# Patient Record
Sex: Male | Born: 1940 | Race: White | Hispanic: No | Marital: Married | Smoking: Former smoker
Health system: Southern US, Community
[De-identification: ages and names within clinical notes are randomized; demographics above are authoritative.]

## PROBLEM LIST (undated history)

## (undated) DIAGNOSIS — E785 Hyperlipidemia, unspecified: Secondary | ICD-10-CM

## (undated) HISTORY — PX: CHOLECYSTECTOMY: SHX55

---

## 2009-07-08 ENCOUNTER — Encounter: Admission: RE | Admit: 2009-07-08 | Discharge: 2009-07-08 | Payer: Self-pay | Admitting: Family Medicine

## 2012-04-19 DIAGNOSIS — Z85828 Personal history of other malignant neoplasm of skin: Secondary | ICD-10-CM | POA: Diagnosis not present

## 2012-04-19 DIAGNOSIS — L570 Actinic keratosis: Secondary | ICD-10-CM | POA: Diagnosis not present

## 2012-04-19 DIAGNOSIS — D235 Other benign neoplasm of skin of trunk: Secondary | ICD-10-CM | POA: Diagnosis not present

## 2012-09-11 DIAGNOSIS — H2510 Age-related nuclear cataract, unspecified eye: Secondary | ICD-10-CM | POA: Diagnosis not present

## 2012-09-11 DIAGNOSIS — H524 Presbyopia: Secondary | ICD-10-CM | POA: Diagnosis not present

## 2012-09-11 DIAGNOSIS — H52209 Unspecified astigmatism, unspecified eye: Secondary | ICD-10-CM | POA: Diagnosis not present

## 2012-09-11 DIAGNOSIS — H43819 Vitreous degeneration, unspecified eye: Secondary | ICD-10-CM | POA: Diagnosis not present

## 2012-10-19 DIAGNOSIS — M25519 Pain in unspecified shoulder: Secondary | ICD-10-CM | POA: Diagnosis not present

## 2013-05-30 DIAGNOSIS — L570 Actinic keratosis: Secondary | ICD-10-CM | POA: Diagnosis not present

## 2013-05-30 DIAGNOSIS — D235 Other benign neoplasm of skin of trunk: Secondary | ICD-10-CM | POA: Diagnosis not present

## 2013-09-21 DIAGNOSIS — H2510 Age-related nuclear cataract, unspecified eye: Secondary | ICD-10-CM | POA: Diagnosis not present

## 2013-09-21 DIAGNOSIS — H524 Presbyopia: Secondary | ICD-10-CM | POA: Diagnosis not present

## 2013-09-21 DIAGNOSIS — H52209 Unspecified astigmatism, unspecified eye: Secondary | ICD-10-CM | POA: Diagnosis not present

## 2013-09-21 DIAGNOSIS — H43819 Vitreous degeneration, unspecified eye: Secondary | ICD-10-CM | POA: Diagnosis not present

## 2014-03-27 DIAGNOSIS — L570 Actinic keratosis: Secondary | ICD-10-CM | POA: Diagnosis not present

## 2014-03-27 DIAGNOSIS — L820 Inflamed seborrheic keratosis: Secondary | ICD-10-CM | POA: Diagnosis not present

## 2014-03-27 DIAGNOSIS — C4441 Basal cell carcinoma of skin of scalp and neck: Secondary | ICD-10-CM | POA: Diagnosis not present

## 2014-03-27 DIAGNOSIS — L821 Other seborrheic keratosis: Secondary | ICD-10-CM | POA: Diagnosis not present

## 2014-04-17 DIAGNOSIS — Z85828 Personal history of other malignant neoplasm of skin: Secondary | ICD-10-CM | POA: Diagnosis not present

## 2014-04-17 DIAGNOSIS — L570 Actinic keratosis: Secondary | ICD-10-CM | POA: Diagnosis not present

## 2014-08-26 DIAGNOSIS — L570 Actinic keratosis: Secondary | ICD-10-CM | POA: Diagnosis not present

## 2014-08-26 DIAGNOSIS — X32XXXD Exposure to sunlight, subsequent encounter: Secondary | ICD-10-CM | POA: Diagnosis not present

## 2014-08-26 DIAGNOSIS — B351 Tinea unguium: Secondary | ICD-10-CM | POA: Diagnosis not present

## 2014-09-04 DIAGNOSIS — M79672 Pain in left foot: Secondary | ICD-10-CM | POA: Diagnosis not present

## 2015-06-02 DIAGNOSIS — L570 Actinic keratosis: Secondary | ICD-10-CM | POA: Diagnosis not present

## 2015-06-02 DIAGNOSIS — X32XXXD Exposure to sunlight, subsequent encounter: Secondary | ICD-10-CM | POA: Diagnosis not present

## 2015-06-02 DIAGNOSIS — L820 Inflamed seborrheic keratosis: Secondary | ICD-10-CM | POA: Diagnosis not present

## 2015-07-15 DIAGNOSIS — Z1211 Encounter for screening for malignant neoplasm of colon: Secondary | ICD-10-CM | POA: Diagnosis not present

## 2015-07-15 DIAGNOSIS — E7800 Pure hypercholesterolemia, unspecified: Secondary | ICD-10-CM | POA: Diagnosis not present

## 2015-07-15 DIAGNOSIS — N319 Neuromuscular dysfunction of bladder, unspecified: Secondary | ICD-10-CM | POA: Diagnosis not present

## 2015-07-15 DIAGNOSIS — R972 Elevated prostate specific antigen [PSA]: Secondary | ICD-10-CM | POA: Diagnosis not present

## 2015-07-15 DIAGNOSIS — M179 Osteoarthritis of knee, unspecified: Secondary | ICD-10-CM | POA: Diagnosis not present

## 2015-07-15 DIAGNOSIS — Z0001 Encounter for general adult medical examination with abnormal findings: Secondary | ICD-10-CM | POA: Diagnosis not present

## 2015-09-03 DIAGNOSIS — R197 Diarrhea, unspecified: Secondary | ICD-10-CM | POA: Diagnosis not present

## 2015-09-03 DIAGNOSIS — T6291XA Toxic effect of unspecified noxious substance eaten as food, accidental (unintentional), initial encounter: Secondary | ICD-10-CM | POA: Diagnosis not present

## 2015-09-23 DIAGNOSIS — R0982 Postnasal drip: Secondary | ICD-10-CM | POA: Diagnosis not present

## 2015-11-12 DIAGNOSIS — H2513 Age-related nuclear cataract, bilateral: Secondary | ICD-10-CM | POA: Diagnosis not present

## 2015-11-12 DIAGNOSIS — H43813 Vitreous degeneration, bilateral: Secondary | ICD-10-CM | POA: Diagnosis not present

## 2015-11-12 DIAGNOSIS — H52203 Unspecified astigmatism, bilateral: Secondary | ICD-10-CM | POA: Diagnosis not present

## 2015-11-12 DIAGNOSIS — H524 Presbyopia: Secondary | ICD-10-CM | POA: Diagnosis not present

## 2015-12-15 DIAGNOSIS — L308 Other specified dermatitis: Secondary | ICD-10-CM | POA: Diagnosis not present

## 2015-12-15 DIAGNOSIS — X32XXXD Exposure to sunlight, subsequent encounter: Secondary | ICD-10-CM | POA: Diagnosis not present

## 2015-12-15 DIAGNOSIS — Z08 Encounter for follow-up examination after completed treatment for malignant neoplasm: Secondary | ICD-10-CM | POA: Diagnosis not present

## 2015-12-15 DIAGNOSIS — Z85828 Personal history of other malignant neoplasm of skin: Secondary | ICD-10-CM | POA: Diagnosis not present

## 2015-12-15 DIAGNOSIS — Z1283 Encounter for screening for malignant neoplasm of skin: Secondary | ICD-10-CM | POA: Diagnosis not present

## 2015-12-15 DIAGNOSIS — L570 Actinic keratosis: Secondary | ICD-10-CM | POA: Diagnosis not present

## 2016-06-28 DIAGNOSIS — L570 Actinic keratosis: Secondary | ICD-10-CM | POA: Diagnosis not present

## 2016-06-28 DIAGNOSIS — L820 Inflamed seborrheic keratosis: Secondary | ICD-10-CM | POA: Diagnosis not present

## 2016-06-28 DIAGNOSIS — X32XXXD Exposure to sunlight, subsequent encounter: Secondary | ICD-10-CM | POA: Diagnosis not present

## 2016-06-28 DIAGNOSIS — B078 Other viral warts: Secondary | ICD-10-CM | POA: Diagnosis not present

## 2016-08-11 DIAGNOSIS — Z0000 Encounter for general adult medical examination without abnormal findings: Secondary | ICD-10-CM | POA: Diagnosis not present

## 2016-08-11 DIAGNOSIS — N319 Neuromuscular dysfunction of bladder, unspecified: Secondary | ICD-10-CM | POA: Diagnosis not present

## 2016-08-11 DIAGNOSIS — Z1211 Encounter for screening for malignant neoplasm of colon: Secondary | ICD-10-CM | POA: Diagnosis not present

## 2016-08-11 DIAGNOSIS — Z125 Encounter for screening for malignant neoplasm of prostate: Secondary | ICD-10-CM | POA: Diagnosis not present

## 2016-08-11 DIAGNOSIS — E7800 Pure hypercholesterolemia, unspecified: Secondary | ICD-10-CM | POA: Diagnosis not present

## 2016-08-11 DIAGNOSIS — M179 Osteoarthritis of knee, unspecified: Secondary | ICD-10-CM | POA: Diagnosis not present

## 2016-08-31 DIAGNOSIS — Z1211 Encounter for screening for malignant neoplasm of colon: Secondary | ICD-10-CM | POA: Diagnosis not present

## 2016-12-20 DIAGNOSIS — L570 Actinic keratosis: Secondary | ICD-10-CM | POA: Diagnosis not present

## 2016-12-20 DIAGNOSIS — X32XXXD Exposure to sunlight, subsequent encounter: Secondary | ICD-10-CM | POA: Diagnosis not present

## 2016-12-20 DIAGNOSIS — C44319 Basal cell carcinoma of skin of other parts of face: Secondary | ICD-10-CM | POA: Diagnosis not present

## 2016-12-20 DIAGNOSIS — L821 Other seborrheic keratosis: Secondary | ICD-10-CM | POA: Diagnosis not present

## 2016-12-20 DIAGNOSIS — D225 Melanocytic nevi of trunk: Secondary | ICD-10-CM | POA: Diagnosis not present

## 2016-12-20 DIAGNOSIS — D1801 Hemangioma of skin and subcutaneous tissue: Secondary | ICD-10-CM | POA: Diagnosis not present

## 2017-01-26 DIAGNOSIS — Z85828 Personal history of other malignant neoplasm of skin: Secondary | ICD-10-CM | POA: Diagnosis not present

## 2017-01-26 DIAGNOSIS — L570 Actinic keratosis: Secondary | ICD-10-CM | POA: Diagnosis not present

## 2017-01-26 DIAGNOSIS — X32XXXD Exposure to sunlight, subsequent encounter: Secondary | ICD-10-CM | POA: Diagnosis not present

## 2017-01-26 DIAGNOSIS — Z08 Encounter for follow-up examination after completed treatment for malignant neoplasm: Secondary | ICD-10-CM | POA: Diagnosis not present

## 2017-01-26 DIAGNOSIS — C44319 Basal cell carcinoma of skin of other parts of face: Secondary | ICD-10-CM | POA: Diagnosis not present

## 2017-06-13 DIAGNOSIS — L820 Inflamed seborrheic keratosis: Secondary | ICD-10-CM | POA: Diagnosis not present

## 2017-06-13 DIAGNOSIS — L308 Other specified dermatitis: Secondary | ICD-10-CM | POA: Diagnosis not present

## 2017-06-13 DIAGNOSIS — L570 Actinic keratosis: Secondary | ICD-10-CM | POA: Diagnosis not present

## 2017-06-13 DIAGNOSIS — D225 Melanocytic nevi of trunk: Secondary | ICD-10-CM | POA: Diagnosis not present

## 2017-06-13 DIAGNOSIS — X32XXXD Exposure to sunlight, subsequent encounter: Secondary | ICD-10-CM | POA: Diagnosis not present

## 2017-08-22 DIAGNOSIS — X32XXXD Exposure to sunlight, subsequent encounter: Secondary | ICD-10-CM | POA: Diagnosis not present

## 2017-08-22 DIAGNOSIS — L820 Inflamed seborrheic keratosis: Secondary | ICD-10-CM | POA: Diagnosis not present

## 2017-08-22 DIAGNOSIS — Z08 Encounter for follow-up examination after completed treatment for malignant neoplasm: Secondary | ICD-10-CM | POA: Diagnosis not present

## 2017-08-22 DIAGNOSIS — Z85828 Personal history of other malignant neoplasm of skin: Secondary | ICD-10-CM | POA: Diagnosis not present

## 2017-08-22 DIAGNOSIS — L570 Actinic keratosis: Secondary | ICD-10-CM | POA: Diagnosis not present

## 2017-09-14 DIAGNOSIS — E7800 Pure hypercholesterolemia, unspecified: Secondary | ICD-10-CM | POA: Diagnosis not present

## 2017-09-14 DIAGNOSIS — Z125 Encounter for screening for malignant neoplasm of prostate: Secondary | ICD-10-CM | POA: Diagnosis not present

## 2017-09-14 DIAGNOSIS — R8290 Unspecified abnormal findings in urine: Secondary | ICD-10-CM | POA: Diagnosis not present

## 2017-09-14 DIAGNOSIS — N319 Neuromuscular dysfunction of bladder, unspecified: Secondary | ICD-10-CM | POA: Diagnosis not present

## 2017-09-14 DIAGNOSIS — Z0000 Encounter for general adult medical examination without abnormal findings: Secondary | ICD-10-CM | POA: Diagnosis not present

## 2017-09-14 DIAGNOSIS — R972 Elevated prostate specific antigen [PSA]: Secondary | ICD-10-CM | POA: Diagnosis not present

## 2017-09-14 DIAGNOSIS — M179 Osteoarthritis of knee, unspecified: Secondary | ICD-10-CM | POA: Diagnosis not present

## 2017-12-14 DIAGNOSIS — X32XXXD Exposure to sunlight, subsequent encounter: Secondary | ICD-10-CM | POA: Diagnosis not present

## 2017-12-14 DIAGNOSIS — L258 Unspecified contact dermatitis due to other agents: Secondary | ICD-10-CM | POA: Diagnosis not present

## 2017-12-14 DIAGNOSIS — L570 Actinic keratosis: Secondary | ICD-10-CM | POA: Diagnosis not present

## 2017-12-14 DIAGNOSIS — B351 Tinea unguium: Secondary | ICD-10-CM | POA: Diagnosis not present

## 2017-12-19 DIAGNOSIS — H43813 Vitreous degeneration, bilateral: Secondary | ICD-10-CM | POA: Diagnosis not present

## 2017-12-19 DIAGNOSIS — H524 Presbyopia: Secondary | ICD-10-CM | POA: Diagnosis not present

## 2017-12-19 DIAGNOSIS — H2513 Age-related nuclear cataract, bilateral: Secondary | ICD-10-CM | POA: Diagnosis not present

## 2017-12-19 DIAGNOSIS — H52203 Unspecified astigmatism, bilateral: Secondary | ICD-10-CM | POA: Diagnosis not present

## 2018-01-21 DIAGNOSIS — R509 Fever, unspecified: Secondary | ICD-10-CM | POA: Diagnosis not present

## 2018-01-23 DIAGNOSIS — N390 Urinary tract infection, site not specified: Secondary | ICD-10-CM | POA: Diagnosis not present

## 2018-03-13 DIAGNOSIS — R338 Other retention of urine: Secondary | ICD-10-CM | POA: Diagnosis not present

## 2018-04-11 DIAGNOSIS — R338 Other retention of urine: Secondary | ICD-10-CM | POA: Diagnosis not present

## 2018-04-11 DIAGNOSIS — N139 Obstructive and reflux uropathy, unspecified: Secondary | ICD-10-CM | POA: Diagnosis not present

## 2018-08-16 DIAGNOSIS — N312 Flaccid neuropathic bladder, not elsewhere classified: Secondary | ICD-10-CM | POA: Diagnosis not present

## 2018-08-16 DIAGNOSIS — R338 Other retention of urine: Secondary | ICD-10-CM | POA: Diagnosis not present

## 2018-10-05 DIAGNOSIS — N319 Neuromuscular dysfunction of bladder, unspecified: Secondary | ICD-10-CM | POA: Diagnosis not present

## 2018-10-05 DIAGNOSIS — Z0000 Encounter for general adult medical examination without abnormal findings: Secondary | ICD-10-CM | POA: Diagnosis not present

## 2018-10-05 DIAGNOSIS — E7800 Pure hypercholesterolemia, unspecified: Secondary | ICD-10-CM | POA: Diagnosis not present

## 2018-10-05 DIAGNOSIS — Z125 Encounter for screening for malignant neoplasm of prostate: Secondary | ICD-10-CM | POA: Diagnosis not present

## 2018-10-05 DIAGNOSIS — M179 Osteoarthritis of knee, unspecified: Secondary | ICD-10-CM | POA: Diagnosis not present

## 2018-10-05 DIAGNOSIS — R972 Elevated prostate specific antigen [PSA]: Secondary | ICD-10-CM | POA: Diagnosis not present

## 2018-11-07 ENCOUNTER — Telehealth: Payer: Self-pay | Admitting: Family Medicine

## 2018-11-07 ENCOUNTER — Emergency Department (HOSPITAL_COMMUNITY): Payer: Managed Care, Other (non HMO)

## 2018-11-07 ENCOUNTER — Observation Stay (HOSPITAL_COMMUNITY)
Admission: EM | Admit: 2018-11-07 | Discharge: 2018-11-07 | Disposition: A | Discharge status: Home / Self Care | Payer: Managed Care, Other (non HMO) | Attending: Internal Medicine | Admitting: Internal Medicine

## 2018-11-07 ENCOUNTER — Observation Stay (HOSPITAL_BASED_OUTPATIENT_CLINIC_OR_DEPARTMENT_OTHER): Payer: Managed Care, Other (non HMO)

## 2018-11-07 ENCOUNTER — Encounter (HOSPITAL_COMMUNITY): Payer: Self-pay | Admitting: Internal Medicine

## 2018-11-07 ENCOUNTER — Other Ambulatory Visit: Payer: Self-pay

## 2018-11-07 ENCOUNTER — Other Ambulatory Visit: Payer: Self-pay | Admitting: Physician Assistant

## 2018-11-07 DIAGNOSIS — Z87891 Personal history of nicotine dependence: Secondary | ICD-10-CM | POA: Diagnosis not present

## 2018-11-07 DIAGNOSIS — R072 Precordial pain: Secondary | ICD-10-CM | POA: Diagnosis not present

## 2018-11-07 DIAGNOSIS — R079 Chest pain, unspecified: Secondary | ICD-10-CM

## 2018-11-07 DIAGNOSIS — Z8249 Family history of ischemic heart disease and other diseases of the circulatory system: Secondary | ICD-10-CM | POA: Insufficient documentation

## 2018-11-07 DIAGNOSIS — I4510 Unspecified right bundle-branch block: Secondary | ICD-10-CM | POA: Diagnosis not present

## 2018-11-07 DIAGNOSIS — D72829 Elevated white blood cell count, unspecified: Secondary | ICD-10-CM | POA: Diagnosis present

## 2018-11-07 DIAGNOSIS — R0902 Hypoxemia: Secondary | ICD-10-CM | POA: Diagnosis not present

## 2018-11-07 DIAGNOSIS — E785 Hyperlipidemia, unspecified: Secondary | ICD-10-CM | POA: Diagnosis present

## 2018-11-07 HISTORY — DX: Hyperlipidemia, unspecified: E78.5

## 2018-11-07 LAB — LIPID PANEL
Cholesterol: 175 mg/dL (ref 0–200)
HDL: 41 mg/dL (ref >40)
LDL Cholesterol: 119 mg/dL — ABNORMAL HIGH (ref 0–99)
Total CHOL/HDL Ratio: 4.3 RATIO
Triglycerides: 75 mg/dL (ref <150)
VLDL: 15 mg/dL (ref 0–40)

## 2018-11-07 LAB — CBC
HCT: 44.9 % (ref 39.0–52.0)
HCT: 47.6 % (ref 39.0–52.0)
Hemoglobin: 14.5 g/dL (ref 13.0–17.0)
Hemoglobin: 15 g/dL (ref 13.0–17.0)
MCH: 29.4 pg (ref 26.0–34.0)
MCH: 28.7 pg (ref 26.0–34.0)
MCHC: 32.3 g/dL (ref 30.0–36.0)
MCHC: 31.5 g/dL (ref 30.0–36.0)
MCV: 91.1 fL (ref 80.0–100.0)
MCV: 91.2 fL (ref 80.0–100.0)
NRBC: 0 % (ref 0.0–0.2)
PLATELETS: 175 10*3/uL (ref 150–400)
Platelets: 166 10*3/uL (ref 150–400)
RBC: 4.93 MIL/uL (ref 4.22–5.81)
RBC: 5.22 MIL/uL (ref 4.22–5.81)
RDW: 13.1 % (ref 11.5–15.5)
RDW: 13.2 % (ref 11.5–15.5)
WBC: 9.4 10*3/uL (ref 4.0–10.5)
WBC: 12 10*3/uL — AB (ref 4.0–10.5)
nRBC: 0 % (ref 0.0–0.2)

## 2018-11-07 LAB — BASIC METABOLIC PANEL
ANION GAP: 11 (ref 5–15)
BUN: 17 mg/dL (ref 8–23)
CALCIUM: 8.7 mg/dL — AB (ref 8.9–10.3)
CO2: 22 mmol/L (ref 22–32)
Chloride: 108 mmol/L (ref 98–111)
Creatinine, Ser: 1.16 mg/dL (ref 0.61–1.24)
Glucose, Bld: 90 mg/dL (ref 70–99)
Potassium: 3.7 mmol/L (ref 3.5–5.1)
Sodium: 141 mmol/L (ref 135–145)

## 2018-11-07 LAB — CREATININE, SERUM: Creatinine, Ser: 1.1 mg/dL (ref 0.61–1.24)

## 2018-11-07 LAB — TROPONIN I
Troponin I: <0.03 ng/mL (ref <0.03)
Troponin I: <0.03 ng/mL (ref <0.03)

## 2018-11-07 LAB — D-DIMER, QUANTITATIVE: D-Dimer, Quant: <0.27 ug{FEU}/mL (ref 0.00–0.50)

## 2018-11-07 LAB — NM MYOCAR MULTI W/SPECT W/WALL MOTION / EF
MPHR: 144 {beats}/min
Peak HR: 104 {beats}/min
Rest HR: 72 {beats}/min

## 2018-11-07 IMAGING — DX CHEST - 2 VIEW
2 series · 2 of 2 positions shown · non-contrast
Comparison: Chest CT dated [DATE]

CLINICAL DATA: 77-year-old male with chest pain.

EXAM:
CHEST - 2 VIEW

[chest pa]
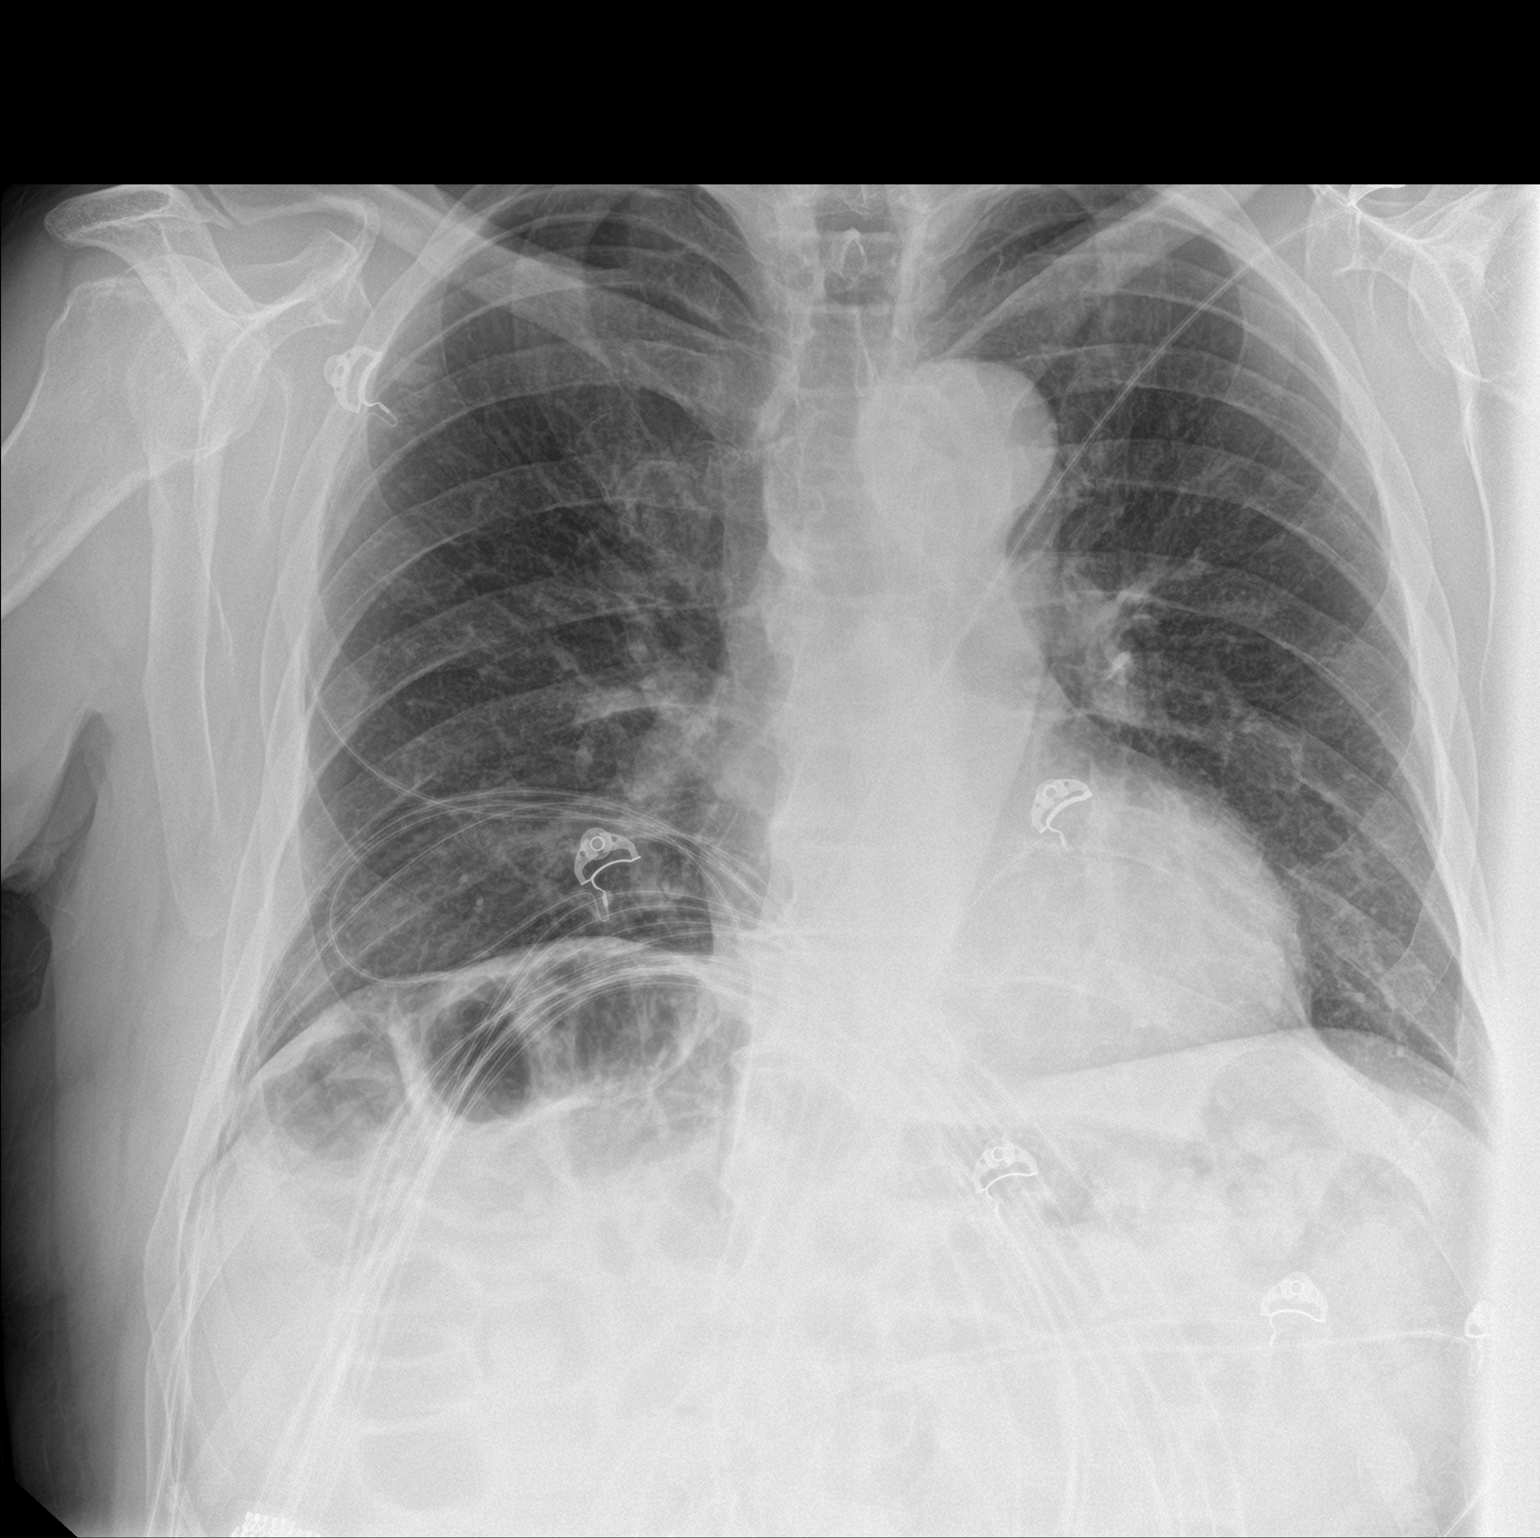

[chest lat]
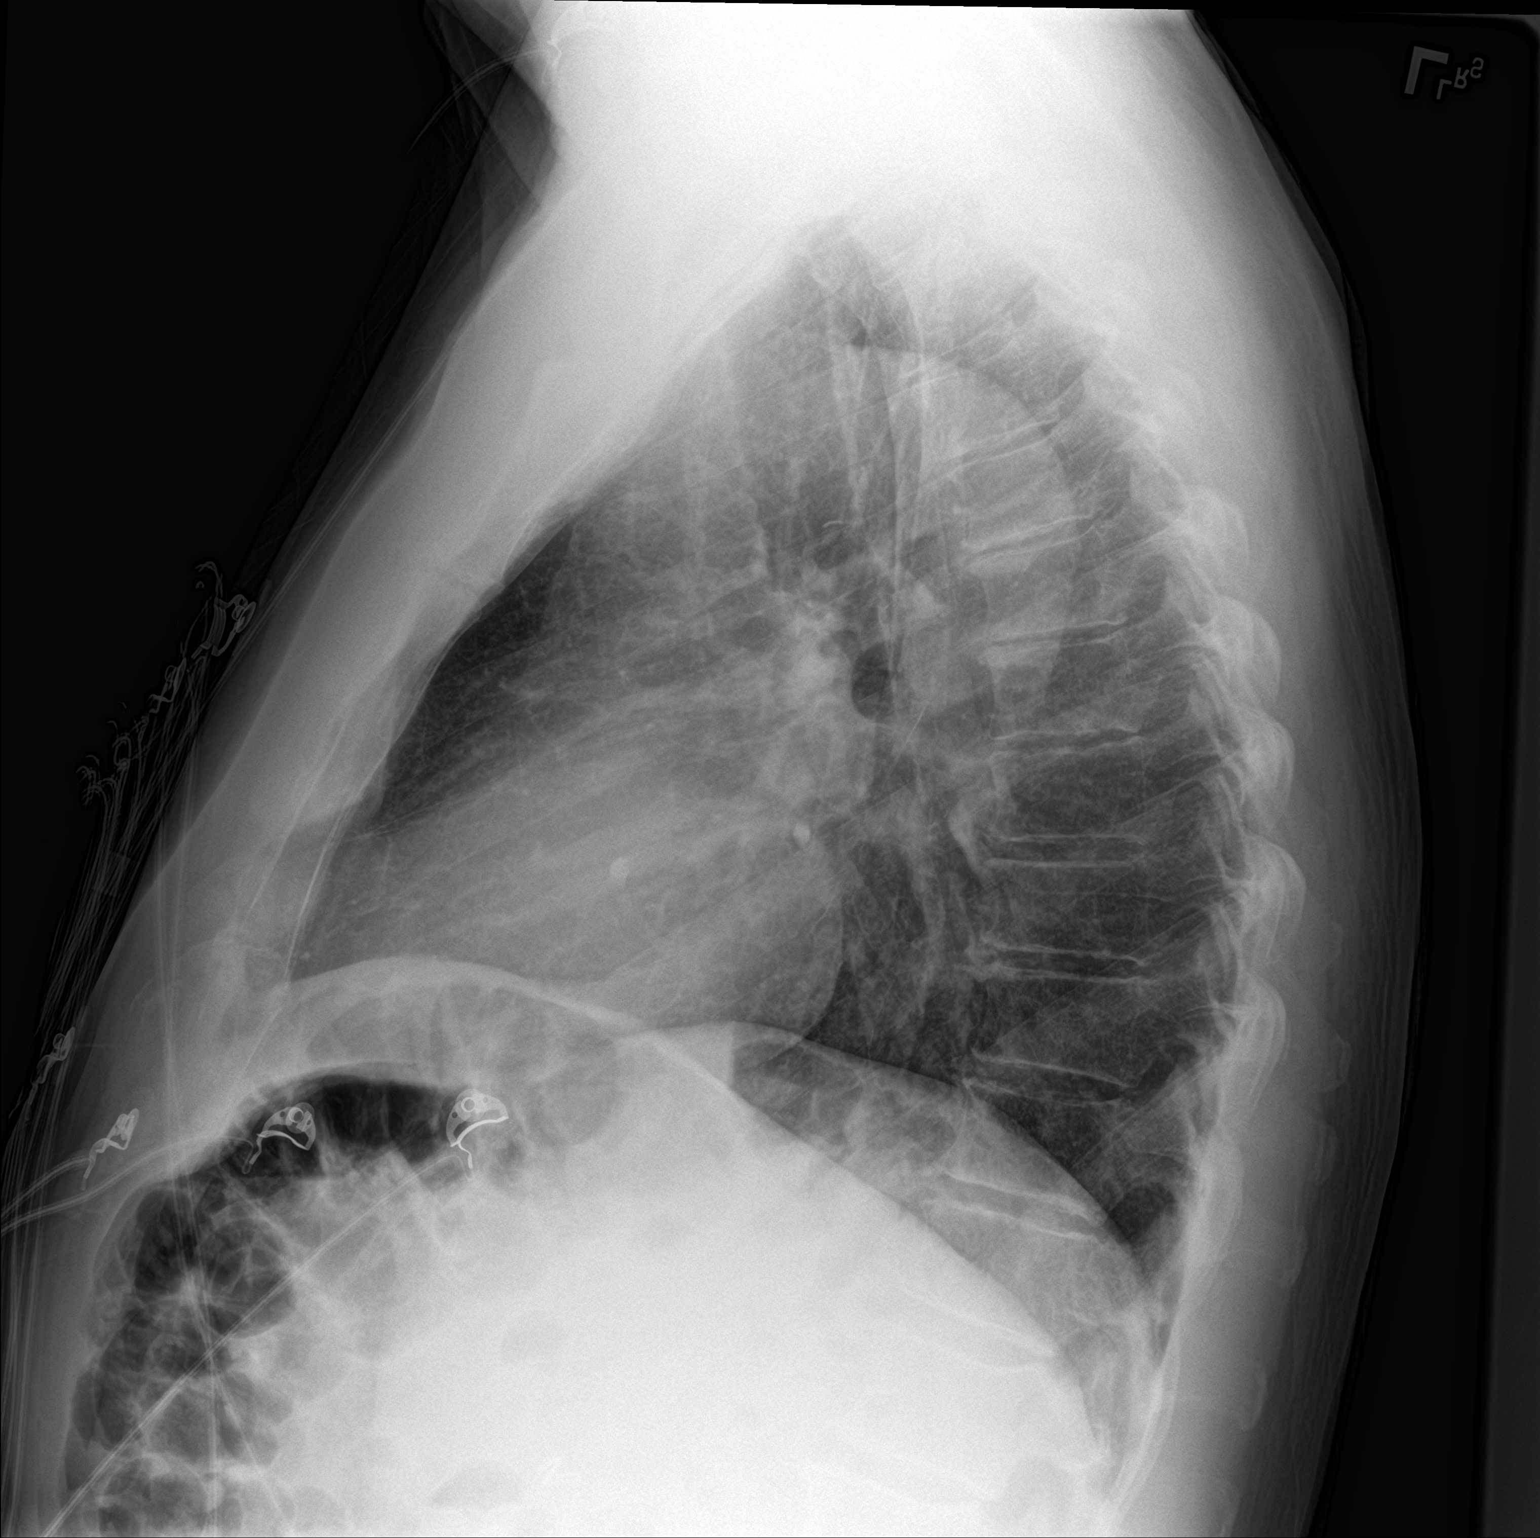

[2 of 2 positions shown; findings below may reference images not displayed]

FINDINGS: There is mild eventration of the right hemidiaphragm. There is no
focal consolidation, pleural effusion, or pneumothorax. The cardiac
silhouette is within normal limits. No acute osseous pathology.
IMPRESSION: No active cardiopulmonary disease.

## 2018-11-07 MED ORDER — REGADENOSON 0.4 MG/5ML IV SOLN
0.4000 mg | Freq: Once | INTRAVENOUS | Status: AC
  Administered 2018-11-07: 0.4 mg via INTRAVENOUS
  Filled 2018-11-07: qty 5

## 2018-11-07 MED ORDER — ASPIRIN EC 81 MG PO TBEC: 81.0000 mg | DELAYED_RELEASE_TABLET | Freq: Every day | ORAL | Status: DC

## 2018-11-07 MED ORDER — MORPHINE SULFATE (PF) 2 MG/ML IV SOLN
2.0000 mg | INTRAVENOUS | Status: DC | PRN
  Administered 2018-11-07: 2 mg via INTRAVENOUS
  Filled 2018-11-07: qty 1

## 2018-11-07 MED ORDER — REGADENOSON 0.4 MG/5ML IV SOLN
INTRAVENOUS | Status: AC
  Filled 2018-11-07: qty 5

## 2018-11-07 MED ORDER — TECHNETIUM TC 99M TETROFOSMIN IV KIT
10.0000 | PACK | Freq: Once | INTRAVENOUS | Status: AC | PRN
  Administered 2018-11-07: 10 via INTRAVENOUS

## 2018-11-07 MED ORDER — ACETAMINOPHEN 325 MG PO TABS: 650.0000 mg | ORAL_TABLET | ORAL | Status: DC | PRN

## 2018-11-07 MED ORDER — ASPIRIN 81 MG PO TBEC: 81.0000 mg | DELAYED_RELEASE_TABLET | Freq: Every day | ORAL | Status: DC

## 2018-11-07 MED ORDER — ONDANSETRON HCL 4 MG/2ML IJ SOLN: 4.0000 mg | Freq: Four times a day (QID) | INTRAMUSCULAR | Status: DC | PRN

## 2018-11-07 MED ORDER — ASPIRIN EC 325 MG PO TBEC
325.0000 mg | DELAYED_RELEASE_TABLET | Freq: Every day | ORAL | Status: DC
  Filled 2018-11-07: qty 1

## 2018-11-07 MED ORDER — TECHNETIUM TC 99M TETROFOSMIN IV KIT
30.0000 | PACK | Freq: Once | INTRAVENOUS | Status: AC | PRN
  Administered 2018-11-07: 30 via INTRAVENOUS

## 2018-11-07 MED ORDER — ENOXAPARIN SODIUM 40 MG/0.4ML ~~LOC~~ SOLN
40.0000 mg | Freq: Every day | SUBCUTANEOUS | Status: DC
  Filled 2018-11-07 (×2): qty 0.4

## 2018-11-07 NOTE — H&P (Signed)
History and Physical    Brandon Porter NOB:[id number] DOB: 31-Jul-1941 DOA: 11/07/2018  PCP: Mayra Neer, MD  Patient coming from: Home.  Chief Complaint: Chest pain.  HPI: Brandon Porter is a 78 y.o. male with no significant past medical history presents to the ER because of chest pain.  Patient states that around 1 AM few hours ago patient started advancing retrosternal chest pressure in the neck and elephant sitting nonradiating with pain increasing on deep inspiration sometimes, woke him up from sleep.  No nausea vomiting abdominal pain productive cough fever chills.  No recent sick contacts.  Took a baby aspirin and called EMS and was advised to take 4 more tablets.  Following which patient chest pain started slowly improving and by the time patient reached ER chest pain resolved.  ED Course: In the ER EKG was showing normal sinus rhythm.  Chest x-ray unremarkable troponin and d-dimer negative given the patient's age and family history of CAD admitted for further work-up.  Review of Systems: As per HPI, rest all negative.   History reviewed. No pertinent past medical history.  History reviewed. No pertinent surgical history.   reports that he has quit smoking. He has never used smokeless tobacco. No history on file for alcohol and drug.  No Known Allergies  Family History  Problem Relation Age of Onset  . CAD Father   . CAD Brother     Prior to Admission medications   Medication Sig Start Date End Date Taking? Authorizing Provider  OVER THE COUNTER MEDICATION Take 1 tablet by mouth daily as needed (allergies).   Yes [provider]    Physical Exam: Vitals:   11/07/18 0313 11/07/18 0315  BP:  117/88  Pulse:  72  Resp:  (!) 21  Temp:  97.8 F (36.6 C)  TempSrc:  Oral  SpO2:  99%  Weight: 96.6 kg   Height: 6\' 2"  (1.88 m)       Constitutional: Moderately built and nourished. Vitals:   11/07/18 0313 11/07/18 0315  BP:  117/88  Pulse:  72  Resp:   (!) 21  Temp:  97.8 F (36.6 C)  TempSrc:  Oral  SpO2:  99%  Weight: 96.6 kg   Height: 6\' 2"  (1.88 m)    Eyes: Anicteric no pallor. ENMT: No discharge from the ears eyes nose or mouth. Neck: No mass felt.  No neck rigidity. Respiratory: No rhonchi or crepitations. Cardiovascular: S1-S2 heard. Abdomen: Soft nontender bowel sounds present. Musculoskeletal: No edema.  No joint effusion. Skin: No rash. Neurologic: Alert awake oriented to time place and person.  Moves all extremities. Psychiatric: Appears normal per normal affect.   Labs on Admission: I have personally reviewed following labs and imaging studies  CBC: Recent Labs  Lab 11/07/18 0314  WBC 9.4  HGB 14.5  HCT 44.9  MCV 91.1  PLT 947   Basic Metabolic Panel: Recent Labs  Lab 11/07/18 0314  NA 141  K 3.7  CL 108  CO2 22  GLUCOSE 90  BUN 17  CREATININE 1.16  CALCIUM 8.7*   GFR: Estimated Creatinine Clearance: 63 mL/min (by C-G formula based on SCr of 1.16 mg/dL). Liver Function Tests: No results for input(s): AST, ALT, ALKPHOS, BILITOT, PROT, ALBUMIN in the last 168 hours. No results for input(s): LIPASE, AMYLASE in the last 168 hours. No results for input(s): AMMONIA in the last 168 hours. Coagulation Profile: No results for input(s): INR, PROTIME in the last 168 hours. Cardiac Enzymes: Recent Labs  Lab 11/07/18 0314  TROPONINI <0.03   BNP (last 3 results) No results for input(s): PROBNP in the last 8760 hours. HbA1C: No results for input(s): HGBA1C in the last 72 hours. CBG: No results for input(s): GLUCAP in the last 168 hours. Lipid Profile: No results for input(s): CHOL, HDL, LDLCALC, TRIG, CHOLHDL, LDLDIRECT in the last 72 hours. Thyroid Function Tests: No results for input(s): TSH, T4TOTAL, FREET4, T3FREE, THYROIDAB in the last 72 hours. Anemia Panel: No results for input(s): VITAMINB12, FOLATE, FERRITIN, TIBC, IRON, RETICCTPCT in the last 72 hours. Urine analysis: No results found  for: COLORURINE, APPEARANCEUR, LABSPEC, PHURINE, GLUCOSEU, HGBUR, BILIRUBINUR, KETONESUR, PROTEINUR, UROBILINOGEN, NITRITE, LEUKOCYTESUR Sepsis Labs: @LABRCNTIP (procalcitonin:4,lacticidven:4) )No results found for this or any previous visit (from the past 240 hour(s)).   Radiological Exams on Admission: Dg Chest 2 View  Result Date: 11/07/2018 CLINICAL DATA:  78 year old male with chest pain. EXAM: CHEST - 2 VIEW COMPARISON:  Chest CT dated 07/08/2009 FINDINGS: There is mild eventration of the right hemidiaphragm. There is no focal consolidation, pleural effusion, or pneumothorax. The cardiac silhouette is within normal limits. No acute osseous pathology. IMPRESSION: No active cardiopulmonary disease. Electronically Signed   By: Anner Crete M.D.   On: 11/07/2018 03:42    EKG: Independently reviewed.  Normal sinus rhythm.  RBBB.  Assessment/Plan Principal Problem:   Chest pain    1. Chest pain concerning for angina given his age and family history.  We will cycle cardiac markers keep patient n.p.o.  D-dimer was negative.  Aspirin.  Consulted cardiology.   DVT prophylaxis: Lovenox. Code Status: Full code. Family Communication: Discussed with patient. Disposition Plan: Home. Consults called: Cardiology. Admission status: Observation.   Rise Patience MD [facility] Pager [phone].  If 7PM-7AM, please contact night-coverage [email or web address] Password [facility]  11/07/2018, 6:41 AM

## 2018-11-07 NOTE — Progress Notes (Signed)
   Brandon Porter presented for a nuclear stress test today.  No immediate complications.  Stress imaging is pending at this time.  Preliminary EKG findings may be listed in the chart, but the stress test result will not be finalized until perfusion imaging is complete.  Charlie Pitter, PA-C 11/07/2018, 12:30 PM

## 2018-11-07 NOTE — Telephone Encounter (Signed)
Received page to call patient overnight however he is not a patient at the family medicine clinic. Called operator to notify they paged the wrong provider.

## 2018-11-07 NOTE — Progress Notes (Addendum)
   Lexiscan result:  IMPRESSION: 1. No definitive scintigraphic evidence of prior infarction pharmacologically induced ischemia. 2. Mild global hypokinesia with relative akinesia involving the septum. 3. Left ventricular ejection fraction - 43%  Dr. Stanford Breed personally reviewed the images and feels there is no evidence of any perfusion abnormalities. The patient did not have any signs of heart failure or symptoms of such, and a nuclear stress test is not the most accurate way to detect this. We will plan for him to have a follow-up visit in 2-3 weeks with APP and then an echo once Covid pandemic settles down as we are only doing urgent echoes in our office currently.   This patient has been deemed a candidate for follow-up tele-health visit to limit community exposure during the Covid-19 pandemic. I spoke with the patient via phone to discuss instructions. This has been outlined on the patient's AVS (dotphrase: [email or web address]). The patient was advised to review the section on consent for treatment as well. The patient will receive a phone call 2-3 days prior to their E-Visit at which time consent will be verbally confirmed. A Virtual Office Visit appointment type has been scheduled for 4/21 with Brandon Porter - pt wishes to try Video. I also sent him email for Mychart signup per our discussion. Reviewed result with pt. I also alerted IM of plan, including Dr. Jacalyn Lefevre recommendation to start baby ASA 81mg  at dc. I sent the echo request to our scheduling department to assist.  Brandon Pitter, PA-C 11/07/2018 3:01 PM

## 2018-11-07 NOTE — Progress Notes (Deleted)
Brandon Porter, is a 78 y.o. male  DOB [birth year]  MRN [id number].  Admission date:  11/07/2018  Admitting Physician  Rise Patience, MD  Discharge Date:  11/07/2018   Primary MD  Mayra Neer, MD  Recommendations for primary care physician for things to follow:   -Cholesterol levels    Discharge Diagnosis   Principal Problem:   Chest pain Active Problems:   Hyperlipidemia   Leukocytosis      Past Medical History:  Diagnosis Date   Hyperlipidemia     Past Surgical History:  Procedure Laterality Date   CHOLECYSTECTOMY         HPI  from the history and physical done on the day of admission:     Brandon Porter is a 78 y.o. male with no significant past medical history presents to the ER because of chest pain.  Patient states that around 1 AM few hours ago patient started advancing retrosternal chest pressure in the neck and elephant sitting nonradiating with pain increasing on deep inspiration sometimes, woke him up from sleep.  No nausea vomiting abdominal pain productive cough fever chills.  No recent sick contacts.  Took a baby aspirin and called EMS and was advised to take 4 more tablets.  Following which patient chest pain started slowly improving and by the time patient reached ER chest pain resolved.  ED Course: In the ER EKG was showing normal sinus rhythm.  Chest x-ray unremarkable troponin and d-dimer negative given the patient's age and family history of CAD admitted for further work-up.    Hospital Course:   1.  Chest pain: Patient presented with acute onset of chest pain.  Patient to 4 baby aspirin prior to arrival, and was given 1 sublingual nitroglycerin with EMS with complete resolution of chest pain.  Troponins have remained negative.  Chest x-ray showed no acute disease.  Family history  of coronary artery disease in his father and his brother.  Cardiology was consulted and recommended stress test.  Stress test showed no clear signs of decreased perfusion, but did note decreased left ventricular heart function at 43%.  Patient without any significant signs of heart failure.  Cardiology set patient up with outpatient telemedicine appointment recommending him to take a daily aspirin.  2.  Leukocytosis: WBC mildly elevated at 12.  Patient denies any cough, fever, chest pain, abdominal pain, diarrhea, or dysuria symptoms at this time.  Chest x-ray was otherwise clear.  Urinalysis was not obtained due to patient not complaining of symptoms.  Unclear cause of leukocytosis at this time.  Of note patient reports intermittent issues with bladder in the past.  May warrant outpatient urinalysis if develops symptoms.  3.  Hyperlipidemia: Acute.  Patient with elevated LDL of 119.  He reports improvement from where it had previously been.  Declines need of medication at this time.  Recommending outpatient follow-up with primary care provider and pain monitoring cholesterol levels.  Follow UP  Follow-up Information    Mayra Neer, MD On 11/10/2018.  Specialty:  Family Medicine Why:  @ 8:30am Contact information: [address] [place] [address] [phone]        Almyra Deforest, [place] Follow up.   Specialties:  Cardiology, Radiology Why:  [facility] - this is a VIRTUAL office visit on 11/28/18 at 2pm with one of our PAs. You will not be physically coming to the office. Please be available by phone 15 minutes before appointment. SEE END OF THIS AFTER-VISIT SUMMARY FOR INSTRUCTIONS. Contact information: [address] [place] [address] [phone]            Consults obtained: Cardiology Dr. Stanford Breed  Discharge Condition: Stable  Diet and Activity recommendation: See Discharge Instructions below   Discharge Instructions    Discharge  instructions   Complete by:  As directed    Follow with Primary MD Mayra Neer, MD at appointment already scheduled.  There was no clear evidence of a blockage to any of the arteries supplying the heart with blood based off the nuclear stress test that was performed.  Cardiology has set you up with follow-up with them through telemedicine.  It is recommended that you take a daily aspirin 81 mg aspirin.   There was note of decreased heart function for which a echocardiogram will be obtained in the outpatient setting with cardiology.  Lastly note that your LDL cholesterol was elevated at 119.  You did not want to be placed on any medications to help lower the levels at this time, but information will be provided on how to help you lower you cholesterol levels with diet.  - need repeat cholesterol testing in 3 to 4 months by your primary care provider.  ( we routinely change or add medications that can affect your baseline labs and fluid status, therefore we recommend that you get the mentioned basic workup next visit with your PCP, your PCP may decide not to get them or add new tests based on their clinical decision)  Activity: As tolerated  Disposition: Home  Diet: Heart Healthy with low cholesterol  Special Instructions: If you have smoked or chewed Tobacco  in the last 2 yrs please stop smoking, stop any regular Alcohol  and or any Recreational drug use.  On your next visit with your primary care physician please Get Medicines reviewed and adjusted.  Please request your Mayra Neer, MD to go over all Hospital Tests and Procedure/Radiological results at the follow up, please get all Hospital records sent to your Prim MD by signing hospital release before you go home.  If you experience worsening of your admission symptoms, develop shortness of breath, life threatening emergency, suicidal or homicidal thoughts you must seek medical attention immediately by calling 911 or calling your MD  immediately  if symptoms less severe.  You Must read complete instructions/literature along with all the possible adverse reactions/side effects for all the Medicines you take and that have been prescribed to you. Take any new Medicines after you have completely understood and accpet all the possible adverse reactions/side effects.   Do not drive, operate heavy machinery, perform activities at heights, swimming or participation in water activities or provide baby sitting services if your were admitted for syncope or siezures until you have seen by Primary MD or a Neurologist and advised to do so again.  Do not drive when taking Pain medications.  Do not take more than prescribed Pain, Sleep and Anxiety Medications  Wear Seat belts while driving.   Please note  You were cared for by a hospitalist during your hospital stay. If you have any questions about your discharge medications or the care you received while you were in the hospital after you are discharged, you can call the unit and asked to speak with the hospitalist on call if the hospitalist that took care of you is not available. Once you are discharged, your primary care physician will handle any further medical issues. Please note that NO REFILLS for any discharge medications will be authorized once you are discharged, as it is imperative that you return to your primary care physician (or establish a relationship with a primary care physician if you do not have one) for your aftercare needs so that they can reassess your need for medications and monitor your lab values.        Discharge Medications     Allergies as of 11/07/2018   No Known Allergies     Medication List    TAKE these medications   aspirin 81 MG EC tablet Take 1 tablet (81 mg total) by mouth daily. Start taking on:  November 08, 2018   OVER THE COUNTER MEDICATION Take 1 tablet by mouth daily as needed (allergies).       Major procedures and Radiology Reports -  PLEASE review detailed and final reports for all details, in brief -   Dg Chest 2 View  Result Date: 11/07/2018 CLINICAL DATA:  78 year old male with chest pain. EXAM: CHEST - 2 VIEW COMPARISON:  Chest CT dated 07/08/2009 FINDINGS: There is mild eventration of the right hemidiaphragm. There is no focal consolidation, pleural effusion, or pneumothorax. The cardiac silhouette is within normal limits. No acute osseous pathology. IMPRESSION: No active cardiopulmonary disease. Electronically Signed   By: Anner Crete M.D.   On: 11/07/2018 03:42   Nm Myocar Multi W/spect W/wall Motion / Ef  Result Date: 11/07/2018 CLINICAL DATA:  Chest pain. EXAM: MYOCARDIAL IMAGING WITH SPECT (REST AND PHARMACOLOGIC-STRESS) GATED LEFT VENTRICULAR WALL MOTION STUDY LEFT VENTRICULAR EJECTION FRACTION TECHNIQUE: Standard myocardial SPECT imaging was performed after resting intravenous injection of 10 mCi Tc-9m tetrofosmin. Subsequently, intravenous infusion of Lexiscan was performed under the supervision of the Cardiology staff. At peak effect of the drug, 30 mCi Tc-40m tetrofosmin was injected intravenously and standard myocardial SPECT imaging was performed. Quantitative gated imaging was also performed to evaluate left ventricular wall motion, and estimate left ventricular ejection fraction. COMPARISON:  Chest radiograph-11/07/2018; chest CT-07/08/2009 FINDINGS: Raw images: GI attenuation is seen on both provided rest stress images. There is no significant patient motion artifact. There is no significant chest wall attenuation. Perfusion: There is a minimal amount of attenuation involving the inferior wall left ventricle which resolves on the provided stress images. There is a grossly matched area of attenuation involving the apex of the left ventricle which is without associated regional wall motion abnormality. No definitive scintigraphic evidence of prior infarction or pharmacologically induced ischemia. Wall Motion: Mild  global hypokinesia with relative akinesia involving the septum of the left ventricle. Left Ventricular Ejection Fraction: 43 % End diastolic volume 976 ml End systolic volume 85 ml IMPRESSION: 1. No definitive scintigraphic evidence of prior infarction pharmacologically induced ischemia. 2. Mild global hypokinesia with relative akinesia involving the septum. 3. Left ventricular ejection fraction - 43% Electronically Signed   By: Sandi Mariscal M.D.   On: 11/07/2018 13:44    Micro Results     No results found for this or any previous visit (from the past 240 hour(s)).  Today   Subjective    Dwaine Gale today denies any recurrence of chest pain, cough, fever, chest pain, abdominal pain, diarrhea, recent sick contacts, or dysuria symptoms at this time.  Patient notes that he just recently had his annual physical exam with his primary care provider and states that his cholesterol levels are down from previous.  Declines being started on any medications at this time.   Objective   Blood pressure 140/77, pulse 78, temperature 98.1 F (36.7 C), temperature source Oral, resp. rate 18, height 6\' 2"  (1.88 m), weight 96.6 kg, SpO2 100 %.  No intake or output data in the 24 hours ending 11/07/18 1536  Exam  Constitutional: Elderly male in NAD, calm, comfortable Eyes: PERRL, lids and conjunctivae normal ENMT: Mucous membranes are moist. Posterior pharynx clear of any exudate or lesions.  Neck: normal, supple, no masses, no thyromegaly Respiratory: clear to auscultation bilaterally, no wheezing, no crackles. Normal respiratory effort. No accessory muscle use.  Cardiovascular: Regular rate and rhythm, no murmurs / rubs / gallops. No extremity edema. 2+ pedal pulses. No carotid bruits.  Abdomen: no tenderness, no masses palpated. No hepatosplenomegaly. Bowel sounds positive.  Musculoskeletal: no clubbing / cyanosis. No joint deformity upper and lower extremities. Good ROM, no contractures.  Normal muscle tone.  Skin: no rashes, lesions, ulcers. No induration Neurologic: CN 2-12 grossly intact. Sensation intact, DTR normal. Strength 5/5 in all 4.  Psychiatric: Normal judgment and insight. Alert and oriented x 3. Normal mood.    Data Review   CBC w Diff:  Lab Results  Component Value Date   WBC 12.0 (H) 11/07/2018   HGB 15.0 11/07/2018   HCT 47.6 11/07/2018   PLT 166 11/07/2018    CMP:  Lab Results  Component Value Date   NA 141 11/07/2018   K 3.7 11/07/2018   CL 108 11/07/2018   CO2 22 11/07/2018   BUN 17 11/07/2018   CREATININE 1.10 11/07/2018  .   Total Time in preparing paper work, data evaluation and todays exam - 35 minutes  Norval Morton M.D on 11/07/2018 at 3:36 PM  [facility]   Office  [phone]

## 2018-11-07 NOTE — ED Notes (Signed)
Patient endorses 1/10 pain on inspiration only.  Reports tenderness to sternum on palpation.

## 2018-11-07 NOTE — Progress Notes (Signed)
Patient reports no distress. Lexiscan portion of test completed.

## 2018-11-07 NOTE — ED Provider Notes (Signed)
[facility] EMERGENCY DEPARTMENT Provider Note   CSN: [id number] Arrival date & time: 11/07/18  0307    History   Chief Complaint Chief Complaint  Patient presents with   Chest Pain    HPI Azad Calame is a 78 y.o. male.  HPI: A 78 year old patient presents for evaluation of chest pain. Initial onset of pain was approximately 1-3 hours ago. The patient's chest pain is described as heaviness/pressure/tightness, is not worse with exertion and is relieved by nitroglycerin. The patient reports some diaphoresis. The patient's chest pain is middle- or left-sided, is not well-localized, is not sharp and does not radiate to the arms/jaw/neck. The patient does not complain of nausea. The patient has a family history of coronary artery disease in a first-degree relative with onset less than age 77. The patient has no history of stroke, has no history of peripheral artery disease, has not smoked in the past 90 days, denies any history of treated diabetes, is not hypertensive, has no history of hypercholesterolemia and does not have an elevated BMI (>=30).   Patient went to bed feeling well.  He woke about 1:30 AM with "elephant sitting on my chest" in the center of his chest that progressively worsened.  There is no radiation of the pain.  There was some shortness of breath when he tried to take a deep breath.  There is no coughing or fever.  There is no nausea or vomiting.  There was some diaphoresis.  He took aspirin and EMS was called.  Chest pain resolved after nitroglycerin from EMS.  He is now chest pain-free.  He is never had this pain in the past and denies any history of coronary artery disease.  Reports negative stress test about 15 years ago.  Has not smoked since he was a teenager.  Reports all of the male relatives in his family died of heart attacks in their 63s and 18s.  Patient normally walks several times a week without a problem.  He does not have any exertional chest  pain or shortness of breath.  He did travel to [place] by car about a month ago.  No leg pain or leg swelling.  The history is provided by the patient and the EMS personnel.  Chest Pain  Associated symptoms: diaphoresis and shortness of breath   Associated symptoms: no abdominal pain, no dizziness, no fever, no headache, no nausea, no vomiting and no weakness     No past medical history on file.  There are no active problems to display for this patient.   * The histories are not reviewed yet. Please review them in the "History" navigator section and refresh this [place].      Home Medications    Prior to Admission medications   Not on File    Family History No family history on file.  Social History Social History   Tobacco Use   Smoking status: Not on file  Substance Use Topics   Alcohol use: Not on file   Drug use: Not on file     Allergies   Patient has no known allergies.   Review of Systems Review of Systems  Constitutional: Positive for diaphoresis. Negative for activity change, appetite change and fever.  HENT: Negative for congestion and rhinorrhea.   Eyes: Negative for photophobia.  Respiratory: Positive for chest tightness and shortness of breath.   Cardiovascular: Positive for chest pain.  Gastrointestinal: Negative for abdominal pain, nausea and vomiting.  Genitourinary: Negative for  dysuria and hematuria.  Musculoskeletal: Negative for arthralgias and myalgias.  Neurological: Negative for dizziness, weakness and headaches.    all other systems are negative except as noted in the HPI and PMH.    Physical Exam Updated Vital Signs BP 117/88 (BP Location: Right Arm)    Pulse 72    Temp 97.8 F (36.6 C) (Oral)    Resp (!) 21    Ht 6\' 2"  (1.88 m)    Wt 96.6 kg    SpO2 99%    BMI 27.35 kg/m   Physical Exam Vitals signs and nursing note reviewed.  Constitutional:      General: He is not in acute distress.    Appearance: He is  well-developed.  HENT:     Head: Normocephalic and atraumatic.     Mouth/Throat:     Pharynx: No oropharyngeal exudate.  Eyes:     Conjunctiva/sclera: Conjunctivae normal.     Pupils: Pupils are equal, round, and reactive to light.  Neck:     Musculoskeletal: Normal range of motion and neck supple.     Comments: No meningismus. Cardiovascular:     Rate and Rhythm: Normal rate and regular rhythm.     Heart sounds: Normal heart sounds. No murmur.  Pulmonary:     Effort: Pulmonary effort is normal. No respiratory distress.     Breath sounds: Normal breath sounds.  Abdominal:     Palpations: Abdomen is soft.     Tenderness: There is no abdominal tenderness. There is no guarding or rebound.  Musculoskeletal: Normal range of motion.        General: No tenderness.  Skin:    General: Skin is warm.  Neurological:     Mental Status: He is alert and oriented to person, place, and time.     Cranial Nerves: No cranial nerve deficit.     Motor: No abnormal muscle tone.     Coordination: Coordination normal.     Comments: No ataxia on finger to nose bilaterally. No pronator drift. 5/5 strength throughout. CN 2-12 intact.Equal grip strength. Sensation intact.   Psychiatric:        Behavior: Behavior normal.      ED Treatments / Results  Labs (all labs ordered are listed, but only abnormal results are displayed) Labs Reviewed  BASIC METABOLIC PANEL - Abnormal; Notable for the following components:      Result Value   Calcium 8.7 (*)    All other components within normal limits  CBC - Abnormal; Notable for the following components:   WBC 12.0 (*)    All other components within normal limits  CBC  TROPONIN I  D-DIMER, QUANTITATIVE (NOT AT [facility])  CREATININE, SERUM  TROPONIN I  LIPID PANEL  TROPONIN I  TROPONIN I  TROPONIN I    EKG EKG Interpretation  Date/Time:  Tuesday November 07 2018 03:12:39 EDT Ventricular Rate:  76 PR Interval:    QRS Duration: 136 QT  Interval:  423 QTC Calculation: 476 R Axis:   -59 Text Interpretation:  Sinus rhythm RBBB and LAFB No previous ECGs available Confirmed by Ezequiel Essex [phone]) on 11/07/2018 3:26:18 AM   Radiology Dg Chest 2 View  Result Date: 11/07/2018 CLINICAL DATA:  78 year old male with chest pain. EXAM: CHEST - 2 VIEW COMPARISON:  Chest CT dated 07/08/2009 FINDINGS: There is mild eventration of the right hemidiaphragm. There is no focal consolidation, pleural effusion, or pneumothorax. The cardiac silhouette is within normal limits. No acute osseous pathology. IMPRESSION:  No active cardiopulmonary disease. Electronically Signed   By: Anner Crete M.D.   On: 11/07/2018 03:42    Procedures Procedures (including critical care time)  Medications Ordered in ED Medications - No data to display   Initial Impression / Assessment and Plan / ED Course  I have reviewed the triage vital signs and the nursing notes.  Pertinent labs & imaging results that were available during my care of the patient were reviewed by me and considered in my medical decision making (see chart for details).    Episode of chest pain awoke from sleep, now resolved after nitroglycerin and aspirin.  EKG with right bundle branch block, no comparison.  Heart score is 6.  Patient remains chest pain-free.  D-dimer was checked given his pleuritic pain and recent travel to [place] this was negative. Low suspicion for PE or aortic dissection.  HEAR Score: 6  Given his age and risk factors we will plan for chest pain rule out.  Discussed with Dr. Hal Hope.   Final Clinical Impressions(s) / ED Diagnoses   Final diagnoses:  Chest pain, unspecified type    ED Discharge Orders    None       Jamya Starry, Annie Main, MD 11/07/18 [phone]

## 2018-11-07 NOTE — Consult Note (Signed)
Cardiology Consultation:   Patient ID: Brandon Porter MRN: [id number]; DOB: 1941/01/02  Admit date: 11/07/2018 Date of Consult: 11/07/2018  Primary Care Provider: Mayra Neer, MD Primary Cardiologist: Kirk Ruths, MD - new Primary Electrophysiologist:  None    Patient Profile:   Brandon Porter is a 78 y.o. male with no significant past medical history who is being seen today for the evaluation of chest pain at the request of Dr. Tamala Julian.  History of Present Illness:   Brandon Porter is a 78 yo male with no significant past medical history. He reports smoking at age 75 for a short time.  No recent smoking. He denies a cardiac history and takes no medications at home. He presented to [facility] after waking up at approximately 1AM this morning with crushing chest pain. He took one baby aspirin and called EMS.  EMS instructed him to take 3 more baby aspirin.  ASA seem to relieve his chest pain.  EMS also administered 1 SL nitro that completely resolved his CP.    Troponin drawn at 0649 was negative.  D-dimer was negative.  EKG was sinus rhythm and a right bundle branch block, unclear if this is a new finding.  No previous EKGs to compare.  Labs are otherwise normal with the exception of WBC of 12.0. CXR negative for an acute cardiopulmonary process.  Given his presenting symptoms and family history, he was admitted to the medicine service for further work-up.  Cardiology was consulted.  On my interview, the patient states the chest pain woke him from sleep at approximately 1:30 AM this morning.  He had no radiation with the pain and no associated symptoms.  The pain was not improved by position change.  He has never had this pain before.  Chest pain was rated as a 6/10.  He took a total of 4 baby aspirin and by the time EMS his chest pain had decreased to a 2/10.  During transport to the ER, he stated his chest pain was worse with deep inspiration.  EMS administered 1 SL nitro.  His chest pain  further reduced to a 0/10.  He is very active with his wife at home.  They walk 2 miles daily without exertional chest pain or shortness of breath.  He feels his pain was directly below his sternum and suggests that it may be musculoskeletal pain.  He has been standing more than normal for the past 2 to 3 days and questions if this is causing his chest pain.  Of note, he and his wife traveled to [place] and returned on February 8.  Upon the return they both developed influenza, although no positive flu swab.  They have both recovered and he has felt well over the past week and a half.  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  He was in the [employer] for 5 years.  He has always been active and is concerned about heart disease given his family history-2 brothers and his father all died of heart disease prior to age 58. He sees PCP at [facility] yearly and has always been healthy (takes no medications).    Past Medical History:  Diagnosis Date   Hyperlipidemia     Past Surgical History:  Procedure Laterality Date   CHOLECYSTECTOMY       Home Medications:  Prior to Admission medications   Medication Sig Start Date End Date Taking? Authorizing Provider  OVER THE COUNTER MEDICATION Take 1  tablet by mouth daily as needed (allergies).   Yes [provider]    Inpatient Medications: Scheduled Meds:  aspirin EC  325 mg Oral Daily   enoxaparin (LOVENOX) injection  40 mg Subcutaneous Daily   Continuous Infusions:  PRN Meds: acetaminophen, morphine injection, ondansetron (ZOFRAN) IV  Allergies:   No Known Allergies  Social History:   Social History   Socioeconomic History   Marital status: Married    Spouse name: Not on file   Number of children: Not on file   Years of education: Not on file   Highest education level: Not on file  Occupational History   Not on file  Social Needs   Financial resource strain: Not on file    Food insecurity:    Worry: Not on file    Inability: Not on file   Transportation needs:    Medical: Not on file    Non-medical: Not on file  Tobacco Use   Smoking status: Former Smoker   Smokeless tobacco: Never Used  Substance and Sexual Activity   Alcohol use: Not on file   Drug use: Not on file   Sexual activity: Not on file  Lifestyle   Physical activity:    Days per week: Not on file    Minutes per session: Not on file   Stress: Not on file  Relationships   Social connections:    Talks on phone: Not on file    Gets together: Not on file    Attends religious service: Not on file    Active member of club or organization: Not on file    Attends meetings of clubs or organizations: Not on file    Relationship status: Not on file   Intimate partner violence:    Fear of current or ex partner: Not on file    Emotionally abused: Not on file    Physically abused: Not on file    Forced sexual activity: Not on file  Other Topics Concern   Not on file  Social History Narrative   Not on file    Family History:    Family History  Problem Relation Age of Onset   CAD Father    CAD Brother      ROS:  Please see the history of present illness.  Patient denies fevers, chills, productive cough, hemoptysis.  All other ROS reviewed and negative.     Physical Exam/Data:   Vitals:   11/07/18 0715 11/07/18 0730 11/07/18 0813 11/07/18 0844  BP: 121/79 111/81 121/87 136/88  Pulse: 85 80 89 78  Resp: 16 19 16 18   Temp:    98.1 F (36.7 C)  TempSrc:    Oral  SpO2: 97% 98% 99% 100%  Weight:      Height:       No intake or output data in the 24 hours ending 11/07/18 0937 Last 3 Weights 11/07/2018  Weight (lbs) 213 lb  Weight (kg) 96.616 kg     Body mass index is 27.35 kg/m.  General:  Well nourished, well developed, in no acute distress HEENT: normal Neck: no JVD Endocrine:  No thryomegaly Vascular: No carotid bruits; FA pulses 2+ bilaterally without  bruits  Cardiac:  normal S1, S2; RRR; no murmur  Lungs:  clear to auscultation bilaterally, no wheezing, rhonchi or rales  Abd: soft, nontender, no hepatomegaly  Ext: no edema Musculoskeletal:  No deformities, BUE and BLE strength normal and equal Skin: warm and dry  Neuro:  CNs  2-12 intact, no focal abnormalities noted Psych:  Normal affect   EKG:  The EKG was personally reviewed and demonstrates:  Sinus rhythm with RBBB Telemetry:  Telemetry was personally reviewed and demonstrates: Sinus with PVC  Relevant CV Studies:  none  Laboratory Data:  Chemistry Recent Labs  Lab 11/07/18 0314 11/07/18 0649  NA 141  --   K 3.7  --   CL 108  --   CO2 22  --   GLUCOSE 90  --   BUN 17  --   CREATININE 1.16 1.10  CALCIUM 8.7*  --   GFRNONAA >60 >60  GFRAA >60 >60  ANIONGAP 11  --     Hematology Recent Labs  Lab 11/07/18 0314 11/07/18 0649  WBC 9.4 12.0*  RBC 4.93 5.22  HGB 14.5 15.0  HCT 44.9 47.6  MCV 91.1 91.2  MCH 29.4 28.7  MCHC 32.3 31.5  RDW 13.1 13.2  PLT 175 166   Cardiac Enzymes Recent Labs  Lab 11/07/18 0314 11/07/18 0649  TROPONINI <0.03 <0.03   DDimer  Recent Labs  Lab 11/07/18 0314  DDIMER <0.27    Radiology/Studies:  Dg Chest 2 View  Result Date: 11/07/2018 CLINICAL DATA:  78 year old male with chest pain. EXAM: CHEST - 2 VIEW COMPARISON:  Chest CT dated 07/08/2009 FINDINGS: There is mild eventration of the right hemidiaphragm. There is no focal consolidation, pleural effusion, or pneumothorax. The cardiac silhouette is within normal limits. No acute osseous pathology. IMPRESSION: No active cardiopulmonary disease. Electronically Signed   By: Anner Crete M.D.   On: 11/07/2018 03:42    Assessment and Plan:   1. Chest pain - troponin negative x 1 6 hours after presenting chest pain - EKG with sinus rhythm and RBBB - unclear if this is a new finding - D-dimer negative - risk factors include family history of CAD, mild hyperlipidemia,  and remote tobacco use - he describes typical and atypical features of chest pain that was responsive to ASA and nitro, but worse with deep inspiration -Continue to trend troponin -He remains chest pain-free - given his presentation and clinical picture, would consider coronary CT   2. Lipids - LDL 127 (2016) - lipid panel penidng   3. Leukocytosis - mild WBC elevation of 12.0 -CXR clear -He reports influenza in Feb but feels he has fully recovered - he is afebrile and without cough        For questions or updates, please contact [place] Please consult [email or web address] for contact info under     Signed, Kirk Ruths, MD  11/07/2018 9:37 AM As above, patient seen and examined.  Briefly he is a 78 year old male with past medical history of hyperlipidemia for evaluation of chest pain.  Patient is very active and typically denies dyspnea on exertion, orthopnea, PND, syncope or exertional chest pain.  Recently returned from a trip to [place] in early February.  States he and his wife developed the flu but has had no significant symptoms in the past 10 days.  Patient was awakened from sleep early a.m. with substernal chest pain described as a baby elephant sitting on his chest.  Some increase with inspiration.  No radiation.  Mild diaphoresis but no dyspnea or nausea.  Duration 1-1/2 hours.  Presently pain-free.  Cardiology asked to evaluate.  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  Troponins normal.  D-dimer negative.  1 chest pain-symptoms with both typical and atypical features.  Enzymes negative.  We will arrange stress nuclear study for risk stratification.  If negative he can be discharged and follow-up with primary care.  2 hyperlipidemia-continue diet.  Follow-up primary care.  Kirk Ruths, MD

## 2018-11-07 NOTE — ED Notes (Signed)
ED TO INPATIENT HANDOFF REPORT  ED Nurse Name and Phone #: Eugene Garnet [phone]  S Name/Age/Gender Brandon Porter 78 y.o. male Room/Bed: 029C/029C  Code Status   Code Status: Full Code  Home/SNF/Other Home Patient oriented to: self, person, place, situation Is this baseline? Yes   Triage Complete: Triage complete  Chief Complaint chest pain  Triage Note Arrived via EMS for chest pain.  Pt took 324 ASA.  EMS x 1 NTG with near resolution of symptoms.  Pt endorses sweating during pain episode, but denies N&V.  Pt and wife traveled to [facility]. 2/15 both had flu symptoms while there, and none since.   Allergies No Known Allergies  Level of Care/Admitting Diagnosis ED Disposition    ED Disposition Condition Comment   Admit  Hospital Area: [facility] [[id number]]  Level of Care: Telemetry Cardiac [103]  I expect the patient will be discharged within 24 hours: Yes  LOW acuity---Tx typically complete <24 hrs---ACUTE conditions typically can be evaluated <24 hours---LABS likely to return to acceptable levels <24 hours---IS near functional baseline---EXPECTED to return to current living arrangement---NOT newly hypoxic: Meets criteria for 5C-Observation unit  Diagnosis: Chest pain [443154]  Admitting Physician: Rise Patience [id number]  Attending Physician: Rise Patience [[id number]  PT Class (Do Not Modify): Observation [104]  PT Acc Code (Do Not Modify): Observation [10022]       B Medical/Surgery History History reviewed. No pertinent past medical history. History reviewed. No pertinent surgical history.   A IV Location/Drains/Wounds Patient Lines/Drains/Airways Status   Active Line/Drains/Airways    Name:   Placement date:   Placement time:   Site:   Days:   Peripheral IV 11/07/18 Left Forearm   11/07/18    0223    Forearm   less than 1          Intake/Output Last 24 hours No intake or output data in the 24 hours ending 11/07/18  7619  Labs/Imaging Results for orders placed or performed during the hospital encounter of 11/07/18 (from the past 48 hour(s))  Basic metabolic panel     Status: Abnormal   Collection Time: 11/07/18  3:14 AM  Result Value Ref Range   Sodium 141 135 - 145 mmol/L   Potassium 3.7 3.5 - 5.1 mmol/L   Chloride 108 98 - 111 mmol/L   CO2 22 22 - 32 mmol/L   Glucose, Bld 90 70 - 99 mg/dL   BUN 17 8 - 23 mg/dL   Creatinine, Ser 1.16 0.61 - 1.24 mg/dL   Calcium 8.7 (L) 8.9 - 10.3 mg/dL   GFR calc non Af Amer >60 >60 mL/min   GFR calc Af Amer >60 >60 mL/min   Anion gap 11 5 - 15    Comment: Performed at [facility], [place] [address] [place], [place] [address]  CBC     Status: None   Collection Time: 11/07/18  3:14 AM  Result Value Ref Range   WBC 9.4 4.0 - 10.5 K/uL   RBC 4.93 4.22 - 5.81 MIL/uL   Hemoglobin 14.5 13.0 - 17.0 g/dL   HCT 44.9 39.0 - 52.0 %   MCV 91.1 80.0 - 100.0 fL   MCH 29.4 26.0 - 34.0 pg   MCHC 32.3 30.0 - 36.0 g/dL   RDW 13.1 11.5 - 15.5 %   Platelets 175 150 - 400 K/uL   nRBC 0.0 0.0 - 0.2 %    Comment: Performed at [facility], [place]  [address], [place], [place] [address]  Troponin I - ONCE - STAT     Status: None   Collection Time: 11/07/18  3:14 AM  Result Value Ref Range   Troponin I <0.03 <0.03 ng/mL    Comment: Performed at [facility] [address] [place], [place] [address]  D-dimer, quantitative (not at [facility])     Status: None   Collection Time: 11/07/18  3:14 AM  Result Value Ref Range   D-Dimer, Quant <0.27 0.00 - 0.50 ug/mL-FEU    Comment: (NOTE) At the manufacturer cut-off of 0.50 ug/mL FEU, this assay has been documented to exclude PE with a sensitivity and negative predictive value of 97 to 99%.  At this time, this assay has not been approved by the FDA to exclude DVT/VTE. Results should be correlated with clinical presentation. Performed at [facility], [place] [address], [place], [place] [address]   CBC     Status:  Abnormal   Collection Time: 11/07/18  6:49 AM  Result Value Ref Range   WBC 12.0 (H) 4.0 - 10.5 K/uL   RBC 5.22 4.22 - 5.81 MIL/uL   Hemoglobin 15.0 13.0 - 17.0 g/dL   HCT 47.6 39.0 - 52.0 %   MCV 91.2 80.0 - 100.0 fL   MCH 28.7 26.0 - 34.0 pg   MCHC 31.5 30.0 - 36.0 g/dL   RDW 13.2 11.5 - 15.5 %   Platelets 166 150 - 400 K/uL   nRBC 0.0 0.0 - 0.2 %    Comment: Performed at [facility], [place] [address] [place], [place] [address]  Creatinine, serum     Status: None   Collection Time: 11/07/18  6:49 AM  Result Value Ref Range   Creatinine, Ser 1.10 0.61 - 1.24 mg/dL   GFR calc non Af Amer >60 >60 mL/min   GFR calc Af Amer >60 >60 mL/min    Comment: Performed at [facility] [address], [place], [place] [address]  Troponin I - Now Then Q6H     Status: None   Collection Time: 11/07/18  6:49 AM  Result Value Ref Range   Troponin I <0.03 <0.03 ng/mL    Comment: Performed at [facility] [address], [place], [place] [address]  Lipid panel     Status: Abnormal   Collection Time: 11/07/18  6:49 AM  Result Value Ref Range   Cholesterol 175 0 - 200 mg/dL   Triglycerides 75 <150 mg/dL   HDL 41 >40 mg/dL   Total CHOL/HDL Ratio 4.3 RATIO   VLDL 15 0 - 40 mg/dL   LDL Cholesterol 119 (H) 0 - 99 mg/dL    Comment:        Total Cholesterol/HDL:CHD Risk Coronary Heart Disease Risk Table                     Men   Women  1/2 Average Risk   3.4   3.3  Average Risk       5.0   4.4  2 X Average Risk   9.6   7.1  3 X Average Risk  23.4   11.0        Use the calculated Patient Ratio above and the CHD Risk Table to determine the patient's CHD Risk.        ATP III CLASSIFICATION (LDL):  <100     mg/dL   Optimal  100-129  mg/dL  Near or Above                    Optimal  130-159  mg/dL   Borderline  160-189  mg/dL   High  >190     mg/dL   Very High Performed at [facility] [address] [place], [place] [address]    Dg Chest 2 View  Result Date:  11/07/2018 CLINICAL DATA:  78 year old male with chest pain. EXAM: CHEST - 2 VIEW COMPARISON:  Chest CT dated 07/08/2009 FINDINGS: There is mild eventration of the right hemidiaphragm. There is no focal consolidation, pleural effusion, or pneumothorax. The cardiac silhouette is within normal limits. No acute osseous pathology. IMPRESSION: No active cardiopulmonary disease. Electronically Signed   By: Anner Crete M.D.   On: 11/07/2018 03:42    Pending Labs Unresulted Labs (From admission, onward)    Start     Ordered   11/14/18 0500  Creatinine, serum  (enoxaparin (LOVENOX)    CrCl >/= 30 ml/min)  Weekly,   R    Comments:  while on enoxaparin therapy    11/07/18 0640   11/07/18 0941  Troponin I - Now Then Q3H  Now then every 3 hours,   R     11/07/18 0707          Vitals/Pain Today's Vitals   11/07/18 0700 11/07/18 0715 11/07/18 0730 11/07/18 0759  BP: 121/84 121/79 111/81   Pulse: 79 85 80   Resp: (!) 25 16 19    Temp:      TempSrc:      SpO2: 99% 97% 98%   Weight:      Height:      PainSc:    0-No pain    Isolation Precautions No active isolations  Medications Medications  acetaminophen (TYLENOL) tablet 650 mg (has no administration in time range)  ondansetron (ZOFRAN) injection 4 mg (has no administration in time range)  enoxaparin (LOVENOX) injection 40 mg (has no administration in time range)  morphine 2 MG/ML injection 2 mg (2 mg Intravenous Given 11/07/18 0749)  aspirin EC tablet 325 mg (has no administration in time range)    Mobility walks     Focused Assessments Cardiac Assessment Handoff:  Cardiac Rhythm: Normal sinus rhythm Lab Results  Component Value Date   TROPONINI <0.03 11/07/2018   Lab Results  Component Value Date   DDIMER <0.27 11/07/2018   Does the Patient currently have chest pain? No     R Recommendations: See Admitting Provider Note  Report given to: Mikle Bosworth  Additional Notes:

## 2018-11-07 NOTE — Discharge Instructions (Signed)

## 2018-11-07 NOTE — ED Triage Notes (Signed)
Arrived via EMS for chest pain.  Pt took 324 ASA.  EMS x 1 NTG with near resolution of symptoms.  Pt endorses sweating during pain episode, but denies N&V.  Pt and wife traveled to [facility]. 2/15 both had flu symptoms while there, and none since.

## 2018-11-08 NOTE — Discharge Summary (Signed)
Brandon Porter, is a 78 y.o. male  DOB 10-Apr-1941  MRN [id number].  Admission date:  11/07/2018  Admitting Physician  Rise Patience, MD  Discharge Date:  11/07/2018   Primary MD  Mayra Neer, MD  Recommendations for primary care physician for things to follow:   -Cholesterol levels    Discharge Diagnosis   Principal Problem:   Chest pain Active Problems:   Hyperlipidemia   Leukocytosis      Past Medical History:  Diagnosis Date   Hyperlipidemia     Past Surgical History:  Procedure Laterality Date   CHOLECYSTECTOMY         HPI  from the history and physical done on the day of admission:     Brandon Porter is a 78 y.o. male with no significant past medical history presents to the ER because of chest pain.  Patient states that around 1 AM few hours ago patient started advancing retrosternal chest pressure in the neck and elephant sitting nonradiating with pain increasing on deep inspiration sometimes, woke him up from sleep.  No nausea vomiting abdominal pain productive cough fever chills.  No recent sick contacts.  Took a baby aspirin and called EMS and was advised to take 4 more tablets.  Following which patient chest pain started slowly improving and by the time patient reached ER chest pain resolved.  ED Course: In the ER EKG was showing normal sinus rhythm.  Chest x-ray unremarkable troponin and d-dimer negative given the patient's age and family history of CAD admitted for further work-up.    Hospital Course:   1.  Chest pain: Patient presented with acute onset of chest pain.  Patient to 4 baby aspirin prior to arrival, and was given 1 sublingual nitroglycerin with EMS with complete resolution of chest pain.  Troponins have remained negative.  Chest x-ray showed no acute disease.  Family history of coronary artery disease in his father and his brother.  Cardiology was  consulted and recommended stress test.  Stress test showed no clear signs of decreased perfusion, but did note decreased left ventricular heart function at 43%.  Patient without any significant signs of heart failure.  Cardiology set patient up with outpatient telemedicine appointment recommending him to take a daily aspirin.  2.  Leukocytosis: WBC mildly elevated at 12.  Patient denies any cough, fever, chest pain, abdominal pain, diarrhea, or dysuria symptoms at this time.  Chest x-ray was otherwise clear.  Urinalysis was not obtained due to patient not complaining of symptoms.  Unclear cause of leukocytosis at this time.  Of note patient reports intermittent issues with bladder in the past.  May warrant outpatient urinalysis if develops symptoms.  3.  Hyperlipidemia: Acute.  Patient with elevated LDL of 119.  He reports improvement from where it had previously been.  Declines need of medication at this time.  Recommending outpatient follow-up with primary care provider and pain monitoring cholesterol levels.  Follow UP  Follow-up Information    Mayra Neer, MD On 11/10/2018.  Specialty:  Family Medicine Why:  @ 8:30am Contact information: [address] [place] [address] [phone]        Almyra Deforest, [place] Follow up.   Specialties:  Cardiology, Radiology Why:  [facility] - this is a VIRTUAL office visit on 11/28/18 at 2pm with one of our PAs. You will not be physically coming to the office. Please be available by phone 15 minutes before appointment. SEE END OF THIS AFTER-VISIT SUMMARY FOR INSTRUCTIONS. Contact information: [address] [place] [address] [phone]            Consults obtained: Cardiology Dr. Stanford Breed  Discharge Condition: Stable  Diet and Activity recommendation: See Discharge Instructions below   Discharge Instructions    Discharge instructions   Complete by:  As directed    Follow with Primary MD Mayra Neer, MD at appointment already scheduled.  There was no clear evidence of a blockage to any of the arteries supplying the heart with blood based off the nuclear stress test that was performed.  Cardiology has set you up with follow-up with them through telemedicine.  It is recommended that you take a daily aspirin 81 mg aspirin.   There was note of decreased heart function for which a echocardiogram will be obtained in the outpatient setting with cardiology.  Lastly note that your LDL cholesterol was elevated at 119.  You did not want to be placed on any medications to help lower the levels at this time, but information will be provided on how to help you lower you cholesterol levels with diet.  - need repeat cholesterol testing in 3 to 4 months by your primary care provider.  ( we routinely change or add medications that can affect your baseline labs and fluid status, therefore we recommend that you get the mentioned basic workup next visit with your PCP, your PCP may decide not to get them or add new tests based on their clinical decision)  Activity: As tolerated  Disposition: Home  Diet: Heart Healthy with low cholesterol  Special Instructions: If you have smoked or chewed Tobacco  in the last 2 yrs please stop smoking, stop any regular Alcohol  and or any Recreational drug use.  On your next visit with your primary care physician please Get Medicines reviewed and adjusted.  Please request your Mayra Neer, MD to go over all Hospital Tests and Procedure/Radiological results at the follow up, please get all Hospital records sent to your Prim MD by signing hospital release before you go home.  If you experience worsening of your admission symptoms, develop shortness of breath, life threatening emergency, suicidal or homicidal thoughts you must seek medical attention immediately by calling 911 or calling your MD immediately  if symptoms less severe.  You Must read complete  instructions/literature along with all the possible adverse reactions/side effects for all the Medicines you take and that have been prescribed to you. Take any new Medicines after you have completely understood and accpet all the possible adverse reactions/side effects.   Do not drive, operate heavy machinery, perform activities at heights, swimming or participation in water activities or provide baby sitting services if your were admitted for syncope or siezures until you have seen by Primary MD or a Neurologist and advised to do so again.  Do not drive when taking Pain medications.  Do not take more than prescribed Pain, Sleep and Anxiety Medications  Wear Seat belts while driving.   Please note  You were cared for by a hospitalist during your hospital stay. If you have any questions about your discharge medications or the care you received while you were in the hospital after you are discharged, you can call the unit and asked to speak with the hospitalist on call if the hospitalist that took care of you is not available. Once you are discharged, your primary care physician will handle any further medical issues. Please note that NO REFILLS for any discharge medications will be authorized once you are discharged, as it is imperative that you return to your primary care physician (or establish a relationship with a primary care physician if you do not have one) for your aftercare needs so that they can reassess your need for medications and monitor your lab values.        Discharge Medications     Allergies as of 11/07/2018   No Known Allergies     Medication List    TAKE these medications   aspirin 81 MG EC tablet Take 1 tablet (81 mg total) by mouth daily.   OVER THE COUNTER MEDICATION Take 1 tablet by mouth daily as needed (allergies).       Major procedures and Radiology Reports - PLEASE review detailed and final reports for all details, in brief -   Dg Chest 2  View  Result Date: 11/07/2018 CLINICAL DATA:  78 year old male with chest pain. EXAM: CHEST - 2 VIEW COMPARISON:  Chest CT dated 07/08/2009 FINDINGS: There is mild eventration of the right hemidiaphragm. There is no focal consolidation, pleural effusion, or pneumothorax. The cardiac silhouette is within normal limits. No acute osseous pathology. IMPRESSION: No active cardiopulmonary disease. Electronically Signed   By: Anner Crete M.D.   On: 11/07/2018 03:42   Nm Myocar Multi W/spect W/wall Motion / Ef  Result Date: 11/07/2018 CLINICAL DATA:  Chest pain. EXAM: MYOCARDIAL IMAGING WITH SPECT (REST AND PHARMACOLOGIC-STRESS) GATED LEFT VENTRICULAR WALL MOTION STUDY LEFT VENTRICULAR EJECTION FRACTION TECHNIQUE: Standard myocardial SPECT imaging was performed after resting intravenous injection of 10 mCi Tc-37m tetrofosmin. Subsequently, intravenous infusion of Lexiscan was performed under the supervision of the Cardiology staff. At peak effect of the drug, 30 mCi Tc-45m tetrofosmin was injected intravenously and standard myocardial SPECT imaging was performed. Quantitative gated imaging was also performed to evaluate left ventricular wall motion, and estimate left ventricular ejection fraction. COMPARISON:  Chest radiograph-11/07/2018; chest CT-07/08/2009 FINDINGS: Raw images: GI attenuation is seen on both provided rest stress images. There is no significant patient motion artifact. There is no significant chest wall attenuation. Perfusion: There is a minimal amount of attenuation involving the inferior wall left ventricle which resolves on the provided stress images. There is a grossly matched area of attenuation involving the apex of the left ventricle which is without associated regional wall motion abnormality. No definitive scintigraphic evidence of prior infarction or pharmacologically induced ischemia. Wall Motion: Mild global hypokinesia with relative akinesia involving the septum of the left  ventricle. Left Ventricular Ejection Fraction: 43 % End diastolic volume 812 ml End systolic volume 85 ml IMPRESSION: 1. No definitive scintigraphic evidence of prior infarction pharmacologically induced ischemia. 2. Mild global hypokinesia with relative akinesia involving the septum. 3. Left ventricular ejection fraction - 43% Electronically Signed   By: Sandi Mariscal M.D.   On: 11/07/2018 13:44    Micro Results     No results found for this or any previous visit (from the past 240 hour(s)).     Today  Subjective    Brandon Porter today denies any recurrence of chest pain, cough, fever, chest pain, abdominal pain, diarrhea, recent sick contacts, or dysuria symptoms at this time.  Patient notes that he just recently had his annual physical exam with his primary care provider and states that his cholesterol levels are down from previous.  Declines being started on any medications at this time.   Objective   Blood pressure 140/77, pulse 78, temperature 98.1 F (36.7 C), temperature source Oral, resp. rate 18, height 6\' 2"  (1.88 m), weight 96.6 kg, SpO2 100 %.  No intake or output data in the 24 hours ending 11/08/18 0659  Exam  Constitutional: Elderly male in NAD, calm, comfortable Eyes: PERRL, lids and conjunctivae normal ENMT: Mucous membranes are moist. Posterior pharynx clear of any exudate or lesions.  Neck: normal, supple, no masses, no thyromegaly Respiratory: clear to auscultation bilaterally, no wheezing, no crackles. Normal respiratory effort. No accessory muscle use.  Cardiovascular: Regular rate and rhythm, no murmurs / rubs / gallops. No extremity edema. 2+ pedal pulses. No carotid bruits.  Abdomen: no tenderness, no masses palpated. No hepatosplenomegaly. Bowel sounds positive.  Musculoskeletal: no clubbing / cyanosis. No joint deformity upper and lower extremities. Good ROM, no contractures. Normal muscle tone.  Skin: no rashes, lesions, ulcers. No  induration Neurologic: CN 2-12 grossly intact. Sensation intact, DTR normal. Strength 5/5 in all 4.  Psychiatric: Normal judgment and insight. Alert and oriented x 3. Normal mood.    Data Review   CBC w Diff:  Lab Results  Component Value Date   WBC 12.0 (H) 11/07/2018   HGB 15.0 11/07/2018   HCT 47.6 11/07/2018   PLT 166 11/07/2018    CMP:  Lab Results  Component Value Date   NA 141 11/07/2018   K 3.7 11/07/2018   CL 108 11/07/2018   CO2 22 11/07/2018   BUN 17 11/07/2018   CREATININE 1.10 11/07/2018  .   Total Time in preparing paper work, data evaluation and todays exam - 35 minutes  Norval Morton M.D on 11/07/2018 at [facility]   Office  [phone]

## 2018-11-10 DIAGNOSIS — R079 Chest pain, unspecified: Secondary | ICD-10-CM | POA: Diagnosis not present

## 2018-11-10 DIAGNOSIS — D72829 Elevated white blood cell count, unspecified: Secondary | ICD-10-CM | POA: Diagnosis not present

## 2018-11-10 DIAGNOSIS — R931 Abnormal findings on diagnostic imaging of heart and coronary circulation: Secondary | ICD-10-CM | POA: Diagnosis not present

## 2018-11-27 ENCOUNTER — Telehealth: Payer: Self-pay | Admitting: Physician Assistant

## 2018-11-27 NOTE — Telephone Encounter (Signed)
Mychart pending, smartphone, pre reg complete 11/27/18 AF °

## 2018-11-28 ENCOUNTER — Telehealth (INDEPENDENT_AMBULATORY_CARE_PROVIDER_SITE_OTHER): Payer: Managed Care, Other (non HMO) | Admitting: Physician Assistant

## 2018-11-28 ENCOUNTER — Encounter: Payer: Self-pay | Admitting: Physician Assistant

## 2018-11-28 ENCOUNTER — Telehealth: Payer: Self-pay

## 2018-11-28 VITALS — BP 126/78 | HR 74 | Ht 74.5 in | Wt 209.0 lb

## 2018-11-28 DIAGNOSIS — E785 Hyperlipidemia, unspecified: Secondary | ICD-10-CM

## 2018-11-28 DIAGNOSIS — R079 Chest pain, unspecified: Secondary | ICD-10-CM | POA: Diagnosis not present

## 2018-11-28 DIAGNOSIS — I4510 Unspecified right bundle-branch block: Secondary | ICD-10-CM

## 2018-11-28 DIAGNOSIS — I519 Heart disease, unspecified: Secondary | ICD-10-CM

## 2018-11-28 NOTE — Progress Notes (Signed)
Virtual Visit via Video Note   This visit type was conducted due to national recommendations for restrictions regarding the COVID-19 Pandemic (e.g. social distancing) in an effort to limit this patient's exposure and mitigate transmission in our community.  Due to his co-morbid illnesses, this patient is at least at moderate risk for complications without adequate follow up.  This format is felt to be most appropriate for this patient at this time.  All issues noted in this document were discussed and addressed.  A limited physical exam was performed with this format.  Please refer to the patient's chart for his consent to telehealth for [facility].   Evaluation Performed:  Follow-up visit  Date:  11/30/2018   ID:  Brandon Porter, DOB May 16, 1941, MRN [id number]  Patient Location: Home Provider Location: Home  PCP:  Mayra Neer, MD  Cardiologist:  Kirk Ruths, MD  Electrophysiologist:  None   Chief Complaint:  Hospital followup  History of Present Illness:    Brandon Porter is a 78 y.o. male with past medical history of hyperlipidemia but no previous cardiac history who recently presented to the hospital on 11/07/2018 was chest pain.  Troponin was negative however EKG demonstrated sinus rhythm with right bundle branch block of unknown duration.  Myoview was performed on 11/07/2018 which revealed EF 43%, mild global hypokinesia with relative akinesis involving the septum, no definitive evidence of previous infarction or ischemia.  The image was later reviewed by Dr. Stanford Breed who felt that there was no evidence of any perfusion abnormality.  Patient was subsequently discharged to follow-up with cardiology service on a outpatient basis and potentially set up by echocardiogram once COVID-19 pandemic is over.  Since discharge, he has not had any chest discomfort or shortness of breath.  He exercises 5 days a week walking several miles daily.  He has not noticed any further symptoms.  I  plan to obtain an echocardiogram in 5 months once the COVID-19 pandemic is controlled and schedule his next follow-up with Dr. Stanford Breed after that.  He is aware that if he has any exertional symptoms, he will need to let us know prior to that.  The patient does not have symptoms concerning for COVID-19 infection (fever, chills, cough, or new shortness of breath).    Past Medical History:  Diagnosis Date   Hyperlipidemia    Past Surgical History:  Procedure Laterality Date   CHOLECYSTECTOMY       Current Meds  Medication Sig   acetaminophen (TYLENOL) 500 MG tablet Take 500 mg by mouth every 6 (six) hours as needed for mild pain or moderate pain.   aspirin EC 81 MG EC tablet Take 1 tablet (81 mg total) by mouth daily. (Patient taking differently: Take 81 mg by mouth every other day. )     Allergies:   Patient has no known allergies.   Social History   Tobacco Use   Smoking status: Former Smoker   Smokeless tobacco: Never Used  Substance Use Topics   Alcohol use: Not on file   Drug use: Not on file     Family Hx: The patient's family history includes CAD in his brother and father.  ROS:   Please see the history of present illness.     All other systems reviewed and are negative.   Prior CV studies:   The following studies were reviewed today:  Myoview 11/07/2018 IMPRESSION: 1. No definitive scintigraphic evidence of prior infarction pharmacologically induced ischemia.  2. Mild global hypokinesia with  relative akinesia involving the septum.  3. Left ventricular ejection fraction - 43%  Labs/Other Tests and Data Reviewed:    EKG:  An ECG dated 11/07/2018 was personally reviewed today and demonstrated:  Normal sinus rhythm with right bundle branch block.  Recent Labs: 11/07/2018: BUN 17; Creatinine, Ser 1.10; Hemoglobin 15.0; Platelets 166; Potassium 3.7; Sodium 141   Recent Lipid Panel Lab Results  Component Value Date/Time   CHOL 175 11/07/2018 06:49  AM   TRIG 75 11/07/2018 06:49 AM   HDL 41 11/07/2018 06:49 AM   CHOLHDL 4.3 11/07/2018 06:49 AM   LDLCALC 119 (H) 11/07/2018 06:49 AM    Wt Readings from Last 3 Encounters:  11/28/18 209 lb (94.8 kg)  11/07/18 213 lb (96.6 kg)     Objective:    Vital Signs:  BP 126/78    Pulse 74    Ht 6' 2.5" (1.892 m)    Wt 209 lb (94.8 kg)    BMI 26.48 kg/m    VITAL SIGNS:  reviewed  ASSESSMENT & PLAN:    1. Chest pain: Resolved, recent Myoview showed EF 43% with relative akinesis in the septum.  This may be related to his right bundle branch block. Continue Aspirin  2. LV dysfunction: EF 43% on recent Myoview.  Plan for echocardiogram in 5 months prior to his next follow-up.  3. 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  4. Hyperlipidemia: Recent lipid panel obtained in March 2020 showed LDL 419.  He is not on any continue diet and activity.  COVID-19 Education: The signs and symptoms of COVID-19 were discussed with the patient and how to seek care for testing (follow up with PCP or arrange E-visit).  The importance of social distancing was discussed today.  Time:   Today, I have spent 15 minutes with the patient with telehealth technology discussing the above problems.     Medication Adjustments/Labs and Tests Ordered: Current medicines are reviewed at length with the patient today.  Concerns regarding medicines are outlined above.   Tests Ordered: Orders Placed This Encounter  Procedures   ECHOCARDIOGRAM COMPLETE    Medication Changes: No orders of the defined types were placed in this encounter.   Disposition:  Follow up in 6 month(s)  Signed, Almyra Deforest, [place]  11/30/2018 10:33 AM    [facility]

## 2018-11-28 NOTE — Telephone Encounter (Signed)
Virtual Visit Pre-Appointment Phone Call  "(Name), I am calling you today to discuss your upcoming appointment. We are currently trying to limit exposure to the virus that causes COVID-19 by seeing patients at home rather than in the office."  1. "What is the BEST phone number to call the day of the visit?" - include this in appointment notes  2. "Do you have or have access to (through a family member/friend) a smartphone with video capability that we can use for your visit?" a. If yes - list this number in appt notes as "cell" (if different from BEST phone #) and list the appointment type as a VIDEO visit in appointment notes b. If no - list the appointment type as a PHONE visit in appointment notes  3. Confirm consent - "In the setting of the current Covid19 crisis, you are scheduled for a VIDEO visit with your provider on 11/28/2018 at 2:00PM.  Just as we do with many in-office visits, in order for you to participate in this visit, we must obtain consent.  If you'd like, I can send this to your mychart (if signed up) or email for you to review.  Otherwise, I can obtain your verbal consent now.  All virtual visits are billed to your insurance company just like a normal visit would be.  By agreeing to a virtual visit, we'd like you to understand that the technology does not allow for your provider to perform an examination, and thus may limit your provider's ability to fully assess your condition. If your provider identifies any concerns that need to be evaluated in person, we will make arrangements to do so.  Finally, though the technology is pretty good, we cannot assure that it will always work on either your or our end, and in the setting of a video visit, we may have to convert it to a phone-only visit.  In either situation, we cannot ensure that we have a secure connection.  Are you willing to proceed?" STAFF: Did the patient verbally acknowledge consent to telehealth visit? Document YES/NO  here: YES  4. Advise patient to be prepared - "Two hours prior to your appointment, go ahead and check your blood pressure, pulse, oxygen saturation, and your weight (if you have the equipment to check those) and write them all down. When your visit starts, your provider will ask you for this information. If you have an Apple Watch or Kardia device, please plan to have heart rate information ready on the day of your appointment. Please have a pen and paper handy nearby the day of the visit as well."  5. Give patient instructions for MyChart download to smartphone OR Doximity/Doxy.me as below if video visit (depending on what platform provider is using)  6. Inform patient they will receive a phone call 15 minutes prior to their appointment time (may be from unknown caller ID) so they should be prepared to answer    TELEPHONE CALL NOTE  Gabrian Hoque has been deemed a candidate for a follow-up tele-health visit to limit community exposure during the Covid-19 pandemic. I spoke with the patient via phone to ensure availability of phone/video source, confirm preferred email & phone number, and discuss instructions and expectations.  I reminded Navid Lenzen to be prepared with any vital sign and/or heart rhythm information that could potentially be obtained via home monitoring, at the time of his visit. I reminded Josede Cicero to expect a phone call prior to his visit.  Jacqulynn Cadet,  CMA 11/28/2018 1:58 PM   INSTRUCTIONS FOR DOWNLOADING THE MYCHART APP TO SMARTPHONE  - The patient must first make sure to have activated MyChart and know their login information - If Apple, go to [employer] and type in MyChart in the search bar and download the app. If Android, ask patient to go to [employer] and type in [place] in the search bar and download the app. The app is free but as with any other app downloads, their phone may require them to verify saved payment information or Apple/Android  password.  - The patient will need to then log into the app with their MyChart username and password, and select [facility] as their healthcare provider to link the account. When it is time for your visit, go to the MyChart app, find appointments, and click Begin Video Visit. Be sure to Select Allow for your device to access the Microphone and Camera for your visit. You will then be connected, and your provider will be with you shortly.  **If they have any issues connecting, or need assistance please contact MyChart service desk [phone])**  **If using a computer, in order to ensure the best quality for their visit they will need to use either of the following Internet Browsers: [employer], or Google Chrome**  IF USING DOXIMITY or DOXY.ME - The patient will receive a link just prior to their visit by text.     FULL LENGTH CONSENT FOR TELE-HEALTH VISIT   I hereby voluntarily request, consent and authorize [facility] and its employed or contracted physicians, physician assistants, nurse practitioners or other licensed health care professionals (the Practitioner), to provide me with telemedicine health care services (the "Services") as deemed necessary by the treating Practitioner. I acknowledge and consent to receive the Services by the Practitioner via telemedicine. I understand that the telemedicine visit will involve communicating with the Practitioner through live audiovisual communication technology and the disclosure of certain medical information by electronic transmission. I acknowledge that I have been given the opportunity to request an in-person assessment or other available alternative prior to the telemedicine visit and am voluntarily participating in the telemedicine visit.  I understand that I have the right to withhold or withdraw my consent to the use of telemedicine in the course of my care at any time, without affecting my right to future care or treatment,  and that the Practitioner or I may terminate the telemedicine visit at any time. I understand that I have the right to inspect all information obtained and/or recorded in the course of the telemedicine visit and may receive copies of available information for a reasonable fee.  I understand that some of the potential risks of receiving the Services via telemedicine include:  Marland Kitchen Delay or interruption in medical evaluation due to technological equipment failure or disruption; . Information transmitted may not be sufficient (e.g. poor resolution of images) to allow for appropriate medical decision making by the Practitioner; and/or  . In rare instances, security protocols could fail, causing a breach of personal health information.  Furthermore, I acknowledge that it is my responsibility to provide information about my medical history, conditions and care that is complete and accurate to the best of my ability. I acknowledge that Practitioner's advice, recommendations, and/or decision may be based on factors not within their control, such as incomplete or inaccurate data provided by me or distortions of diagnostic images or specimens that may result from electronic transmissions. I understand that the practice of  medicine is not an Chief Strategy Officer and that Practitioner makes no warranties or guarantees regarding treatment outcomes. I acknowledge that I will receive a copy of this consent concurrently upon execution via email to the email address I last provided but may also request a printed copy by calling the office of [facility].    I understand that my insurance will be billed for this visit.   I have read or had this consent read to me. . I understand the contents of this consent, which adequately explains the benefits and risks of the Services being provided via telemedicine.  . I have been provided ample opportunity to ask questions regarding this consent and the Services and have had my questions  answered to my satisfaction. . I give my informed consent for the services to be provided through the use of telemedicine in my medical care  By participating in this telemedicine visit I agree to the above.

## 2018-11-28 NOTE — Patient Instructions (Signed)
Medication Instructions:   Your physician recommends that you continue on your current medications as directed. Please refer to the Current Medication list given to you today.  If you need a refill on your cardiac medications before your next appointment, please call your pharmacy.   Lab work:  NONE ordered at this time of appointment   If you have labs (blood work) drawn today and your tests are completely normal, you will receive your results only by: Marland Kitchen MyChart Message (if you have MyChart) OR . A paper copy in the mail If you have any lab test that is abnormal or we need to change your treatment, we will call you to review the results.  Testing/Procedures: Your physician has requested that you have an echocardiogram. Echocardiography is a painless test that uses sound waves to create images of your heart. It provides your doctor with information about the size and shape of your heart and how well your heart's chambers and valves are working. This procedure takes approximately one hour. There are no restrictions for this procedure.    Follow-Up: At [facility], you and your health needs are our priority.  As part of our continuing mission to provide you with exceptional heart care, we have created designated Provider Care Teams.  These Care Teams include your primary Cardiologist (physician) and Advanced Practice Providers (APPs -  Physician Assistants and Nurse Practitioners) who all work together to provide you with the care you need, when you need it. You will need a follow up appointment in 6 months.  Please call our office 2 months in advance to schedule this appointment.  You may see Kirk Ruths, MD or one of the following Advanced Practice Providers on your designated Care Team:   Kerin Ransom, PA-C Roby Lofts, [place] . Sande Rives, PA-C  Any Other Special Instructions Will Be Listed Below (If Applicable).

## 2018-12-27 DIAGNOSIS — H2513 Age-related nuclear cataract, bilateral: Secondary | ICD-10-CM | POA: Diagnosis not present

## 2018-12-27 DIAGNOSIS — H52203 Unspecified astigmatism, bilateral: Secondary | ICD-10-CM | POA: Diagnosis not present

## 2018-12-27 DIAGNOSIS — H43813 Vitreous degeneration, bilateral: Secondary | ICD-10-CM | POA: Diagnosis not present

## 2018-12-27 DIAGNOSIS — H524 Presbyopia: Secondary | ICD-10-CM | POA: Diagnosis not present

## 2019-01-18 DIAGNOSIS — H52222 Regular astigmatism, left eye: Secondary | ICD-10-CM | POA: Diagnosis not present

## 2019-01-18 DIAGNOSIS — H2512 Age-related nuclear cataract, left eye: Secondary | ICD-10-CM | POA: Diagnosis not present

## 2019-02-14 ENCOUNTER — Ambulatory Visit (HOSPITAL_COMMUNITY): Payer: Managed Care, Other (non HMO) | Attending: Cardiovascular Disease

## 2019-02-14 ENCOUNTER — Other Ambulatory Visit: Payer: Self-pay

## 2019-02-14 DIAGNOSIS — R079 Chest pain, unspecified: Secondary | ICD-10-CM | POA: Diagnosis not present

## 2019-02-15 ENCOUNTER — Telehealth: Payer: Self-pay | Admitting: *Deleted

## 2019-02-15 DIAGNOSIS — I493 Ventricular premature depolarization: Secondary | ICD-10-CM

## 2019-02-15 NOTE — Telephone Encounter (Signed)
3 day ZIO XT long term holter monitor to be mailed to patient.  Instructions review briefly as they are included in the monitor kit.  Patient aware he is required to wear 3 day monitor at least 24 hours.

## 2019-02-15 NOTE — Telephone Encounter (Signed)
-----   Message from Charlie Pitter, [place] sent at 02/15/2019  8:07 AM EDT ----- Please let patient know EF was low-normal, better than recent nuc (50-55%). The echocardiogram suggested there is some stiffening of the heart muscle, which is what we call diastolic dysfunction. In addition to good blood pressure control and achieving or maintaining a healthy weight, would advise to generally stick to lower sodium diet (aiming for maximum 2,000mg  per day) and staying hydrated but not to excess. In general about 64oz per day of all fluid intake would be an ideal maximum.  There were frequent PVCs noted during the study. I would suggest getting a 24 hour Zio to evaluate PVC burden. Hao's last note from 11/2018 indicated plan to get echo in 5 months but will route this to him to let him know it was done (was requested by hospital rounding MD at dc). Dayna Dunn PA-C

## 2019-02-19 ENCOUNTER — Ambulatory Visit (INDEPENDENT_AMBULATORY_CARE_PROVIDER_SITE_OTHER): Payer: Managed Care, Other (non HMO)

## 2019-02-19 DIAGNOSIS — I493 Ventricular premature depolarization: Secondary | ICD-10-CM | POA: Diagnosis not present

## 2019-02-22 DIAGNOSIS — H2511 Age-related nuclear cataract, right eye: Secondary | ICD-10-CM | POA: Diagnosis not present

## 2019-03-02 ENCOUNTER — Telehealth: Payer: Self-pay | Admitting: *Deleted

## 2019-03-02 MED ORDER — METOPROLOL SUCCINATE ER 25 MG PO TB24: 12.5000 mg | ORAL_TABLET | Freq: Every day | ORAL | 12 refills | Status: DC

## 2019-03-02 NOTE — Telephone Encounter (Signed)
Spoke with pt, he agrees to the metoprolol and New script sent to the pharmacy

## 2019-03-02 NOTE — Telephone Encounter (Addendum)
Left message for pt to call   ----- Message from Lelon Perla, MD sent at 03/02/2019  9:04 AM EDT ----- LV function low normal on echo; add toprol 12.5 mg daily Kirk Ruths

## 2019-08-22 DIAGNOSIS — N312 Flaccid neuropathic bladder, not elsewhere classified: Secondary | ICD-10-CM | POA: Diagnosis not present

## 2019-08-22 DIAGNOSIS — R338 Other retention of urine: Secondary | ICD-10-CM | POA: Diagnosis not present

## 2020-07-23 DIAGNOSIS — R8279 Other abnormal findings on microbiological examination of urine: Secondary | ICD-10-CM | POA: Diagnosis not present

## 2020-07-23 DIAGNOSIS — R350 Frequency of micturition: Secondary | ICD-10-CM | POA: Diagnosis not present

## 2020-07-23 DIAGNOSIS — N312 Flaccid neuropathic bladder, not elsewhere classified: Secondary | ICD-10-CM | POA: Diagnosis not present

## 2020-07-23 DIAGNOSIS — N139 Obstructive and reflux uropathy, unspecified: Secondary | ICD-10-CM | POA: Diagnosis not present

## 2020-08-29 DIAGNOSIS — N139 Obstructive and reflux uropathy, unspecified: Secondary | ICD-10-CM | POA: Diagnosis not present

## 2020-08-29 DIAGNOSIS — R8279 Other abnormal findings on microbiological examination of urine: Secondary | ICD-10-CM | POA: Diagnosis not present

## 2020-08-29 DIAGNOSIS — N312 Flaccid neuropathic bladder, not elsewhere classified: Secondary | ICD-10-CM | POA: Diagnosis not present

## 2020-09-01 DIAGNOSIS — D485 Neoplasm of uncertain behavior of skin: Secondary | ICD-10-CM | POA: Diagnosis not present

## 2020-09-01 DIAGNOSIS — L570 Actinic keratosis: Secondary | ICD-10-CM | POA: Diagnosis not present

## 2020-09-02 ENCOUNTER — Other Ambulatory Visit (HOSPITAL_COMMUNITY): Payer: Self-pay | Admitting: Family Medicine

## 2020-09-02 DIAGNOSIS — H6092 Unspecified otitis externa, left ear: Secondary | ICD-10-CM | POA: Diagnosis not present

## 2020-10-01 DIAGNOSIS — H60502 Unspecified acute noninfective otitis externa, left ear: Secondary | ICD-10-CM | POA: Diagnosis not present

## 2020-10-01 DIAGNOSIS — H9193 Unspecified hearing loss, bilateral: Secondary | ICD-10-CM | POA: Diagnosis not present

## 2020-10-01 DIAGNOSIS — J342 Deviated nasal septum: Secondary | ICD-10-CM | POA: Diagnosis not present

## 2020-10-22 DIAGNOSIS — N319 Neuromuscular dysfunction of bladder, unspecified: Secondary | ICD-10-CM | POA: Diagnosis not present

## 2020-10-22 DIAGNOSIS — E7800 Pure hypercholesterolemia, unspecified: Secondary | ICD-10-CM | POA: Diagnosis not present

## 2020-10-22 DIAGNOSIS — C4491 Basal cell carcinoma of skin, unspecified: Secondary | ICD-10-CM | POA: Diagnosis not present

## 2020-10-22 DIAGNOSIS — Z0000 Encounter for general adult medical examination without abnormal findings: Secondary | ICD-10-CM | POA: Diagnosis not present

## 2020-11-07 DIAGNOSIS — Z23 Encounter for immunization: Secondary | ICD-10-CM | POA: Diagnosis not present

## 2020-11-13 DIAGNOSIS — C44329 Squamous cell carcinoma of skin of other parts of face: Secondary | ICD-10-CM | POA: Diagnosis not present

## 2020-11-19 DIAGNOSIS — N312 Flaccid neuropathic bladder, not elsewhere classified: Secondary | ICD-10-CM | POA: Diagnosis not present

## 2020-11-19 DIAGNOSIS — R109 Unspecified abdominal pain: Secondary | ICD-10-CM | POA: Diagnosis not present

## 2020-11-19 DIAGNOSIS — R8279 Other abnormal findings on microbiological examination of urine: Secondary | ICD-10-CM | POA: Diagnosis not present

## 2021-02-03 DIAGNOSIS — B351 Tinea unguium: Secondary | ICD-10-CM | POA: Diagnosis not present

## 2021-02-03 DIAGNOSIS — L821 Other seborrheic keratosis: Secondary | ICD-10-CM | POA: Diagnosis not present

## 2021-02-03 DIAGNOSIS — L409 Psoriasis, unspecified: Secondary | ICD-10-CM | POA: Diagnosis not present

## 2021-02-03 DIAGNOSIS — L578 Other skin changes due to chronic exposure to nonionizing radiation: Secondary | ICD-10-CM | POA: Diagnosis not present

## 2021-02-03 DIAGNOSIS — L570 Actinic keratosis: Secondary | ICD-10-CM | POA: Diagnosis not present

## 2021-02-03 DIAGNOSIS — D485 Neoplasm of uncertain behavior of skin: Secondary | ICD-10-CM | POA: Diagnosis not present

## 2021-02-03 DIAGNOSIS — D225 Melanocytic nevi of trunk: Secondary | ICD-10-CM | POA: Diagnosis not present

## 2021-02-03 DIAGNOSIS — Z85828 Personal history of other malignant neoplasm of skin: Secondary | ICD-10-CM | POA: Diagnosis not present

## 2021-02-03 DIAGNOSIS — L814 Other melanin hyperpigmentation: Secondary | ICD-10-CM | POA: Diagnosis not present

## 2021-02-26 DIAGNOSIS — C44519 Basal cell carcinoma of skin of other part of trunk: Secondary | ICD-10-CM | POA: Diagnosis not present

## 2021-03-13 DIAGNOSIS — U071 COVID-19: Secondary | ICD-10-CM | POA: Diagnosis not present

## 2021-04-02 ENCOUNTER — Telehealth: Payer: Self-pay | Admitting: Cardiology

## 2021-04-02 NOTE — Telephone Encounter (Signed)
Spoke with patient of Dr. Stanford Breed who reports his Brandon Porter reports AFib earlier today (HR in 30s) but is now reading normal (70s)  His recent BP was WNL  He reports no symptoms during reported AFib episode  He reports this is the first time he recorded AFib on KardiaMobile device  He has a PDF of his strip - will send via MyChart message for MD to review

## 2021-04-02 NOTE — Telephone Encounter (Signed)
Patient c/o Palpitations:  High priority if patient c/o lightheadedness, shortness of breath, or chest pain  How long have you had palpitations/irregular HR/ Afib? Are you having the symptoms now?  Patient states his [facility] picked up afib, but he's asymptomatic  Are you currently experiencing lightheadedness, SOB or CP?  No   Do you have a history of afib (atrial fibrillation) or irregular heart rhythm?  Yes   Have you checked your BP or HR? (document readings if available):  08/24: BP-125/85 08/25: BP-125/66 Oxygen: 96  Are you experiencing any other symptoms?  No

## 2021-04-03 ENCOUNTER — Other Ambulatory Visit: Payer: Self-pay

## 2021-04-03 ENCOUNTER — Ambulatory Visit (INDEPENDENT_AMBULATORY_CARE_PROVIDER_SITE_OTHER): Payer: PRIVATE HEALTH INSURANCE | Admitting: Medical

## 2021-04-03 VITALS — BP 100/58 | HR 84 | Ht 75.0 in | Wt 213.0 lb

## 2021-04-03 DIAGNOSIS — I499 Cardiac arrhythmia, unspecified: Secondary | ICD-10-CM

## 2021-04-03 DIAGNOSIS — R002 Palpitations: Secondary | ICD-10-CM

## 2021-04-03 DIAGNOSIS — N319 Neuromuscular dysfunction of bladder, unspecified: Secondary | ICD-10-CM | POA: Diagnosis not present

## 2021-04-03 DIAGNOSIS — E785 Hyperlipidemia, unspecified: Secondary | ICD-10-CM

## 2021-04-03 DIAGNOSIS — I480 Paroxysmal atrial fibrillation: Secondary | ICD-10-CM

## 2021-04-03 MED ORDER — APIXABAN 5 MG PO TABS: 5.0000 mg | ORAL_TABLET | Freq: Two times a day (BID) | ORAL | 11 refills | Status: DC

## 2021-04-03 MED ORDER — METOPROLOL SUCCINATE ER 25 MG PO TB24: 12.5000 mg | ORAL_TABLET | ORAL | 3 refills | Status: DC | PRN

## 2021-04-03 NOTE — Progress Notes (Addendum)
Cardiology Office Note   Date:  04/04/2021   ID:  [name], DOB June 13, 1941, MRN [id number]  PCP:  Mayra Neer, MD  Cardiologist:  Kirk Ruths, MD EP: None  Chief Complaint  Patient presents with   Atrial Fibrillation       History of Present Illness: Brandon Porter is a 80 y.o. male with a PMH of HLD and neurogenic ladder, who presents for possible atrial fibrillation.  She was last evaluated by cardiology via a telemedicine visit with Almyra Deforest, PA-C 11/2018 following a recent hospital visit for chest pain 10/2018. He underwent a NST that admission which showed no evidence of prior infarction or ischemia but possible reduced LV function with EF 43%. He subsequently had an outpatient echocardiogram 7/2-2- which showed low normal LV function with EF 50-55%, G1DD, no RWMA, mild biatrial enlargement, and no significant valvular dysfunction. He had a cardiac monitor which showed sinus rhythm (brady, normal, and tachy), brief PAT, PVCs, and 5 beats of NSVT. He was recommended to start metoprolol succinate 12.'5mg'$  daily which the patient ultimately discontinued due to side effects of brain fog.   He contacted our office 04/02/21 to report concerns for Atrial fibrillation on a [employer]. He submitted rhythm strips which were reviewed by Dr. Stanford Breed who confirmed strips looked c/f new atrial fibrillation and he was recommended for an office visit.  He presents today with concerns for new onset atrial fibrillation. On 04/02/21 he went for a walk as usual that morning and upon returning home he noticed his chest felt funny. Had some palpitations but no chest pain or SOB. He used his wife's [employer] which suggested he was in atrial fibrillation. He said this lasted ~1-2 hours then resolved spontaneously without recurrence. He thinks this is the first occurrence of atrial fibrillation. We reviewed the nature of atrial fibrillation and risk for stroke going forward. He  states he is not someone who likes to be on medications and is hesitant to take a blood thinner. He is leaving for a 4 week trip out [place] on Monday 04/06/21. Ideally we would get a cardiac monitor on him to determine Afib burden, however with him leaving on a trip this would be challenging. We discussed utilizing the Kardia mobile app for recurrent symptoms and keeping a log to bring to his follow-up visit. We will also plan to get a cardiac monitor once he returns from his trip to assess for recurrent Afib. He is active at baseline, walking/exercising daily and denies chest pain or SOB. No complaints of dizziness, lightheadedness, or syncope. No complaints of orthopnea or LE edema. He does snore but denies known apneic episodes, daytime somnolence, or PND. He has not had his thyroid function checked recently. He has no complaints of melena or hematochezia. He has a neurogenic bladder and self caths, with occasional hematuria if he cuts himself.      Past Medical History:  Diagnosis Date   Hyperlipidemia     Past Surgical History:  Procedure Laterality Date   CHOLECYSTECTOMY       Current Outpatient Medications  Medication Sig Dispense Refill   acetaminophen (TYLENOL) 500 MG tablet Take 500 mg by mouth every 6 (six) hours as needed for mild pain or moderate pain.     amoxicillin (AMOXIL) 500 MG capsule TAKE 2 CAPSULES BY MOUTH TWICE A DAY FOR 10 DAYS 40 capsule 0   apixaban (ELIQUIS) 5 MG TABS tablet Take 1 tablet (5 mg total)  by mouth 2 (two) times daily. 60 tablet 11   metoprolol succinate (TOPROL XL) 25 MG 24 hr tablet Take 0.5 tablets (12.5 mg total) by mouth as needed (for palpitations). 45 tablet 3   OVER THE COUNTER MEDICATION Take 1 tablet by mouth daily as needed (allergies).     No current facility-administered medications for this visit.    Allergies:   Patient has no known allergies.    Social History:  The patient  reports that he has quit smoking. He has never used  smokeless tobacco.   Family History:  The patient's family history includes CAD in his brother and father.    ROS:  Please see the history of present illness.   Otherwise, review of systems are positive for none.   All other systems are reviewed and negative.    PHYSICAL EXAM: VS:  BP (!) 100/58 (BP Location: Right Arm, Patient Position: Sitting, Cuff Size: Normal)   Pulse 84   Ht '6\' 3"'$  (1.905 m)   Wt 213 lb (96.6 kg)   SpO2 95%   BMI 26.62 kg/m  , BMI Body mass index is 26.62 kg/m. GEN: Well nourished, well developed, in no acute distress HEENT: sclera anicteric Neck: no JVD, carotid bruits, or masses Cardiac: RRR; no murmurs, rubs, or gallops,no edema  Respiratory:  clear to auscultation bilaterally, normal work of breathing GI: soft, nontender, nondistended, + BS MS: no deformity or atrophy Skin: warm and dry, no rash Neuro:  Strength and sensation are intact Psych: euthymic mood, full affect   EKG:  EKG is ordered today. The ekg ordered today demonstrates sinus rhythm, rate 78 bpm, RBBB, no STE/D, no TWI; no significant change from previous.   Recent Labs: No results found for requested labs within last 8760 hours.    Lipid Panel    Component Value Date/Time   CHOL 175 11/07/2018 0649   TRIG 75 11/07/2018 0649   HDL 41 11/07/2018 0649   CHOLHDL 4.3 11/07/2018 0649   VLDL 15 11/07/2018 0649   LDLCALC 119 (H) 11/07/2018 0649      Wt Readings from Last 3 Encounters:  04/03/21 213 lb (96.6 kg)  11/28/18 209 lb (94.8 kg)  11/07/18 213 lb (96.6 kg)      Other studies Reviewed: Additional studies/ records that were reviewed today include:   NST 10/2018: IMPRESSION: 1. No definitive scintigraphic evidence of prior infarction pharmacologically induced ischemia.   2. Mild global hypokinesia with relative akinesia involving the septum.   3. Left ventricular ejection fraction - 43%  Echocardiogram 02/2019: 1. The left ventricle has low normal systolic  function, with an ejection  fraction of 50-55%. The cavity size was mildly dilated. Left ventricular  diastolic Doppler parameters are consistent with impaired relaxation. No  evidence of left ventricular  regional wall motion abnormalities.   2. The right ventricle has normal systolic function. The cavity was  normal. There is no increase in right ventricular wall thickness. Right  ventricular systolic pressure could not be assessed.   3. Left atrial size was mildly dilated.   4. Right atrial size was mildly dilated.   5. The aortic root and ascending aorta are normal in size and structure.   Cardiac monitor 02/2019: Sinus bradycardia, NSR, sinus tachycardia, PACs, brief PAT, PVCs and 5 beats NSVT   ASSESSMENT AND PLAN:   1. New onset paroxysmal atrial fibrillation: EKG today shows sinus rhythm. Had a 1-2 hour episode of atrial fibrillation with HR in the 90s yesterday.  Presumably his first episode. He noted sensation of palpitations but no chest pain, SOB, dizziness, lightheadedness, or syncope. CHA2DS2-VASc Score = 2 [CHF History: No, HTN History: No, Diabetes History: No, Stroke History: No, Vascular Disease History: No, Age Score: 2, Gender Score: 0].  Therefore, the patient's annual risk of stroke is 2.2 %. We discussed stroke risk in detail. He is hesitant to start eliquis for stroke ppx. Ideally would place 30 day monitor to assess Afib burden but he is leaving for a 4 week trip out [place] on Monday 04/06/21.  - Recommend starting eliquis '5mg'$  BID - he will fill prescription but wants to think about it more before starting - Stroke signs reviewed in detail with low threshold to activate EMS if present - Will give Rx for metoprolol succinate 12.'5mg'$  daily as needed for persistent atrial fibrillation. He has been on this medication around the clock in the past and felt brain fog so is not interested in taking daily again but could be useful prn - Will place order for 30 day monitor upon return  from his trip - Will check TSH as well as lipid panel and A1C for risk stratification - Will consider updating an echocardiogram if monitor shows increased burden of Afib  2. HLD: LDL 115 on last check in 2020. Not on medications - Continue dietary/lifestyle modifications to promote lower cholesterol - Will update FLP after return from his trip  3. Neurogenic bladder: self caths 5x per day. Occasionally injures himself but no persistent hematuria - Continue management per urology    Current medicines are reviewed at length with the patient today.  The patient does not have concerns regarding medicines.  The following changes have been made:  As above  Labs/ tests ordered today include:   Orders Placed This Encounter  Procedures   TSH   Lipid panel   HgB A1c   CARDIAC EVENT MONITOR      Disposition:   FU with myself or Dr. Stanford Breed in 2-3 months following completion of his cardiac monitor  Signed, Abigail Butts, PA-C  04/04/2021 7:05 AM

## 2021-04-03 NOTE — Telephone Encounter (Signed)
Spoke with pt, aware he needs to be seen for atrial fib, he reports he is leaving Monday morning at 6:30 am for a 4 week trip. He will come to the office this afternoon to be seen.

## 2021-04-03 NOTE — Patient Instructions (Signed)
Medication Instructions:  1.Start eliquis 5 mg, take one tablet by mouth twice a day. 2.Take metoprolol succinate 12.5 mg by mouth as needed for palpitations, this will be one half of a 25 mg tablet *If you need a refill on your cardiac medications before your next appointment, please call your pharmacy*   Lab Work: TSH in one month  If you have labs (blood work) drawn today and your tests are completely normal, you will receive your results only by: [place] (if you have MyChart) OR A paper copy in the mail If you have any lab test that is abnormal or we need to change your treatment, we will call you to review the results.   Testing/Procedures:  Your physician has recommended that you wear a 30 day event monitor. Event monitors are medical devices that record the heart's electrical activity. Doctors most often [place] these monitors to diagnose arrhythmias. Arrhythmias are problems with the speed or rhythm of the heartbeat. The monitor is a small, portable device. You can wear one while you do your normal daily activities. This is usually used to diagnose what is causing palpitations/syncope (passing out).    Follow-Up: At [facility], you and your health needs are our priority.  As part of our continuing mission to provide you with exceptional heart care, we have created designated Provider Care Teams.  These Care Teams include your primary Cardiologist (physician) and Advanced Practice Providers (APPs -  Physician Assistants and Nurse Practitioners) who all work together to provide you with the care you need, when you need it.   Your next appointment:   3 month(s)  The format for your next appointment:   In Person  Provider:   You may see Kirk Ruths, MD or one of the following Advanced Practice Providers on your designated Care Team:   Roby Lofts PA-C   Other Instructions Use your kardia monitor and keep a log of your episodes

## 2021-04-04 ENCOUNTER — Encounter: Payer: Self-pay | Admitting: Medical

## 2021-04-04 NOTE — Addendum Note (Signed)
Addended by: Roby Lofts on: 04/04/2021 07:06 AM   Modules accepted: Orders

## 2021-04-09 NOTE — Addendum Note (Signed)
Addended by: Jacqulynn Cadet on: 04/09/2021 02:46 PM   Modules accepted: Orders

## 2021-04-10 DIAGNOSIS — U071 COVID-19: Secondary | ICD-10-CM | POA: Diagnosis not present

## 2021-04-10 DIAGNOSIS — R509 Fever, unspecified: Secondary | ICD-10-CM | POA: Diagnosis not present

## 2021-06-15 DIAGNOSIS — Z20822 Contact with and (suspected) exposure to covid-19: Secondary | ICD-10-CM | POA: Diagnosis not present

## 2021-06-19 ENCOUNTER — Telehealth: Payer: Self-pay | Admitting: Cardiology

## 2021-06-19 NOTE — Telephone Encounter (Signed)
  Pt is requesting to switch from Dr. Stanford Breed to Dr. Harrell Gave, he wanted to have the same heart doctor with his wife.

## 2021-06-22 NOTE — Telephone Encounter (Signed)
Ok by me

## 2021-06-23 ENCOUNTER — Encounter (HOSPITAL_BASED_OUTPATIENT_CLINIC_OR_DEPARTMENT_OTHER): Payer: Self-pay | Admitting: Cardiology

## 2021-06-23 ENCOUNTER — Ambulatory Visit (INDEPENDENT_AMBULATORY_CARE_PROVIDER_SITE_OTHER): Payer: Medicare Other | Admitting: Cardiology

## 2021-06-23 ENCOUNTER — Other Ambulatory Visit: Payer: Self-pay

## 2021-06-23 VITALS — BP 118/70 | HR 72 | Ht 75.0 in | Wt 205.2 lb

## 2021-06-23 DIAGNOSIS — R002 Palpitations: Secondary | ICD-10-CM | POA: Diagnosis not present

## 2021-06-23 DIAGNOSIS — Z7189 Other specified counseling: Secondary | ICD-10-CM

## 2021-06-23 DIAGNOSIS — N319 Neuromuscular dysfunction of bladder, unspecified: Secondary | ICD-10-CM

## 2021-06-23 DIAGNOSIS — I480 Paroxysmal atrial fibrillation: Secondary | ICD-10-CM | POA: Diagnosis not present

## 2021-06-23 NOTE — Patient Instructions (Signed)

## 2021-06-23 NOTE — Progress Notes (Signed)
Cardiology Office Note:    Date:  06/23/2021   ID:  Brandon Porter, DOB 1941-05-02, MRN [id number]  PCP:  Mayra Neer, MD  Cardiologist:  Buford Dresser, MD  Referring MD: Mayra Neer, MD   CC: general follow up/new to me   History of Present Illness:    Brandon Porter is a 80 y.o. male with a hx of hyperlipidemia who is seen for follow-up and to establish care with me. He was previously a patient of Dr. Stanford Breed.   Today: He monitors his blood pressure regularly and is generally not concerned about his cardiac health.   He reports his atrial fibrillation was new and that it occurred twice. Since then his atrial fibrillation has not recurred and he is doing well overall. He uses a KardiaMobile to help monitor for Afib.  Initially he did start anticoagulation. However, he noticed that his navigational thought processes while driving were interrupted by brain fog due to his blood thinner. He is not on anticoagulation at this time.  Occasionally he will bleed very easily with minor cuts.   For exercise he walks a couple miles a day, or up to 4 miles an hour/30 minutes at the gym. About 3 times a week he participates in an exercise program that includes some weight lifting. He was also hiking at 10k feet elevation recently and did not experience any bothersome symptoms.   He denies any chest pain, or shortness of breath. No lightheadedness, headaches, syncope, orthopnea, PND, or lower extremity edema.  Past Medical History:  Diagnosis Date   Hyperlipidemia     Past Surgical History:  Procedure Laterality Date   CHOLECYSTECTOMY      Current Medications: Current Outpatient Medications on File Prior to Visit  Medication Sig   acetaminophen (TYLENOL) 500 MG tablet Take 500 mg by mouth every 6 (six) hours as needed for mild pain or moderate pain.   ibuprofen (ADVIL) 200 MG tablet Take 200 mg by mouth every 6 (six) hours as needed for fever, headache, mild  pain, moderate pain or cramping.   metoprolol succinate (TOPROL XL) 25 MG 24 hr tablet Take 0.5 tablets (12.5 mg total) by mouth as needed (for palpitations).   No current facility-administered medications on file prior to visit.     Allergies:   Patient has no known allergies.   Social History   Tobacco Use   Smoking status: Former   Smokeless tobacco: Never    Family History: family history includes CAD in his brother and father.  ROS:   Please see the history of present illness. (+) Easy bleeding All other systems are reviewed and negative.    EKGs/Labs/Other Studies Reviewed:    The following studies were reviewed today:  Monitor 02/2019: Sinus bradycardia, NSR, sinus tachycardia, PACs, brief PAT, PVCs and 5 beats NSVT  Echo 02/14/2019:  1. The left ventricle has low normal systolic function, with an ejection  fraction of 50-55%. The cavity size was mildly dilated. Left ventricular  diastolic Doppler parameters are consistent with impaired relaxation. No  evidence of left ventricular  regional wall motion abnormalities.   2. The right ventricle has normal systolic function. The cavity was  normal. There is no increase in right ventricular wall thickness. Right  ventricular systolic pressure could not be assessed.   3. Left atrial size was mildly dilated.   4. Right atrial size was mildly dilated.   5. The aortic root and ascending aorta are normal in size and structure.  SUMMARY  Frequent PVCs occurred during the study.   Nuclear Stress Myoview 11/07/2018: COMPARISON:  Chest radiograph-11/07/2018; chest CT-07/08/2009   FINDINGS: Raw images: GI attenuation is seen on both provided rest stress images. There is no significant patient motion artifact. There is no significant chest wall attenuation.   Perfusion: There is a minimal amount of attenuation involving the inferior wall left ventricle which resolves on the provided stress images. There is a grossly  matched area of attenuation involving the apex of the left ventricle which is without associated regional wall motion abnormality. No definitive scintigraphic evidence of prior infarction or pharmacologically induced ischemia.   Wall Motion: Mild global hypokinesia with relative akinesia involving the septum of the left ventricle.   Left Ventricular Ejection Fraction: 43 %   End diastolic volume 578 ml   End systolic volume 85 ml   IMPRESSION: 1. No definitive scintigraphic evidence of prior infarction pharmacologically induced ischemia.   2. Mild global hypokinesia with relative akinesia involving the septum.   3. Left ventricular ejection fraction - 43%  EKG:  EKG is personally reviewed.   06/23/2021: NSR, RBBB, PVC's at 72 bpm  Recent Labs: No results found for requested labs within last 8760 hours.   Recent Lipid Panel    Component Value Date/Time   CHOL 175 11/07/2018 0649   TRIG 75 11/07/2018 0649   HDL 41 11/07/2018 0649   CHOLHDL 4.3 11/07/2018 0649   VLDL 15 11/07/2018 0649   LDLCALC 119 (H) 11/07/2018 0649    Physical Exam:    VS:  BP 118/70 (BP Location: Left Arm, Patient Position: Sitting, Cuff Size: Normal)   Pulse 72   Ht 6\' 3"  (1.905 m)   Wt 205 lb 3.2 oz (93.1 kg)   BMI 25.65 kg/m     Wt Readings from Last 3 Encounters:  06/23/21 205 lb 3.2 oz (93.1 kg)  04/03/21 213 lb (96.6 kg)  11/28/18 209 lb (94.8 kg)    GEN: Well nourished, well developed in no acute distress HEENT: Normal, moist mucous membranes NECK: No JVD CARDIAC: regular rhythm with one early beat, normal S1 and S2, no rubs or gallops. No murmur. VASCULAR: Radial and DP pulses 2+ bilaterally. No carotid bruits RESPIRATORY:  Clear to auscultation without rales, wheezing or rhonchi  ABDOMEN: Soft, non-tender, non-distended MUSCULOSKELETAL:  Ambulates independently SKIN: Warm and dry, no edema NEUROLOGIC:  Alert and oriented x 3. No focal neuro deficits noted. PSYCHIATRIC:   Normal affect    ASSESSMENT:    1. Paroxysmal atrial fibrillation (HCC)   2. Palpitations   3. Neurogenic bladder   4. Cardiac risk counseling   5. Counseling on health promotion and disease prevention    PLAN:    Paroxysmal atrial fibrillation -two separate events, symptomatic -chadsvasc=2 -uses metoprolol PRN with good success in management. Felt foggy with it before, prefers to take only as needed -discussed the following: Afib is abnormal rhythm of the top chamber of the heart. It can be triggered by many different things, including stress, infection, surgery, age, etc. The rhythm itself, when not fast, is not life threatening. We make sure the heart rate is not fast, and we talk about the risk of small blood clots forming in the heart. If these break loose they can cause a stroke. We prevent this with blood thinning medication, but these medications also increase risk for bleeding. We discuss the balance between bleeding and stroke with each individual patient.  -after shared decision making, patient declines anticoagulation  at this time. If symptoms occur more frequently or last longer, he will contact us. He is very concerned about the risk of bleeding and understands the risk of stroke. He has neurogenic bladder and has to self cath, no current hematuria but has had issues in the past.  Cardiac risk counseling and prevention recommendations: -recommend heart healthy/Mediterranean diet, with whole grains, fruits, vegetable, fish, lean meats, nuts, and olive oil. Limit salt. -recommend moderate walking, 3-5 times/week for 30-50 minutes each session. Aim for at least 150 minutes.week. Goal should be pace of 3 miles/hours, or walking 1.5 miles in 30 minutes -recommend avoidance of tobacco products. Avoid excess alcohol. -ASCVD risk score: elevated but above the age recommended for primary prevention  Plan for follow up: 1 year or sooner as needed.  Buford Dresser, MD, PhD,  [facility]    Medication Adjustments/Labs and Tests Ordered: Current medicines are reviewed at length with the patient today.  Concerns regarding medicines are outlined above.   Orders Placed This Encounter  Procedures   EKG 12-Lead    No orders of the defined types were placed in this encounter.  Patient Instructions  Medication Instructions:  Your Physician recommend you continue on your current medication as directed.    *If you need a refill on your cardiac medications before your next appointment, please call your pharmacy*   Lab Work: None ordered today   Testing/Procedures: None ordered today   Follow-Up: At [facility], you and your health needs are our priority.  As part of our continuing mission to provide you with exceptional heart care, we have created designated Provider Care Teams.  These Care Teams include your primary Cardiologist (physician) and Advanced Practice Providers (APPs -  Physician Assistants and Nurse Practitioners) who all work together to provide you with the care you need, when you need it.  We recommend signing up for the patient portal called "MyChart".  Sign up information is provided on this After Visit Summary.  MyChart is used to connect with patients for Virtual Visits (Telemedicine).  Patients are able to view lab/test results, encounter notes, upcoming appointments, etc.  Non-urgent messages can be sent to your provider as well.   To learn more about what you can do with MyChart, go to [email or web address].    Your next appointment:   1 year(s)  The format for your next appointment:   In Person  Provider:   Buford Dresser, MD      [facility] Stump[place],acting as a scribe for Buford Dresser, MD.,have documented all relevant documentation on the behalf of Buford Dresser, MD,as directed by  Buford Dresser, MD while in the presence of Buford Dresser, MD.  I, Buford Dresser, MD, have reviewed all documentation for this visit. The documentation on 06/23/21 for the exam, diagnosis, procedures, and orders are all accurate and complete.   Signed, Buford Dresser, MD PhD 06/23/2021 8:47 AM    [place]

## 2021-07-08 ENCOUNTER — Ambulatory Visit: Payer: Medicare Other | Admitting: General Practice

## 2021-07-08 ENCOUNTER — Ambulatory Visit: Payer: Medicare Other | Admitting: Medical

## 2021-08-20 ENCOUNTER — Encounter (HOSPITAL_BASED_OUTPATIENT_CLINIC_OR_DEPARTMENT_OTHER): Payer: Self-pay

## 2021-08-20 DIAGNOSIS — I480 Paroxysmal atrial fibrillation: Secondary | ICD-10-CM

## 2021-08-20 DIAGNOSIS — Z79899 Other long term (current) drug therapy: Secondary | ICD-10-CM

## 2021-08-20 MED ORDER — APIXABAN 5 MG PO TABS: 5.0000 mg | ORAL_TABLET | Freq: Two times a day (BID) | ORAL | 0 refills | Status: DC

## 2021-08-20 NOTE — Telephone Encounter (Signed)
Pt updated with plan and also made aware NP would like to order lab work. Pt verbalized understanding.

## 2021-08-20 NOTE — Telephone Encounter (Signed)
Please advise 

## 2021-08-20 NOTE — Telephone Encounter (Signed)
Given persistent atrial fibrillation recommend he take Metoprolol Succinate one 25mg  tablet daily until returns to normal sinus rhythm. Given persistent atrial fibrillation and risk for stroke, will need to resume Eliquis 5mg  BID.  Recommend prompt follow up visit in the next 1-2 weeks with Dr. Harrell Gave or APP for EKG and follow up of atrial fibrillation.   Okay to resume exercise as tolerated.   Loel Dubonnet, NP

## 2021-08-21 DIAGNOSIS — I480 Paroxysmal atrial fibrillation: Secondary | ICD-10-CM | POA: Diagnosis not present

## 2021-08-21 DIAGNOSIS — Z79899 Other long term (current) drug therapy: Secondary | ICD-10-CM | POA: Diagnosis not present

## 2021-08-22 LAB — COMPREHENSIVE METABOLIC PANEL
ALT: 13 IU/L (ref 0–44)
AST: 19 IU/L (ref 0–40)
Albumin/Globulin Ratio: 1.6 (ref 1.2–2.2)
Albumin: 4 g/dL (ref 3.7–4.7)
Alkaline Phosphatase: 83 IU/L (ref 44–121)
BUN/Creatinine Ratio: 16 (ref 10–24)
BUN: 17 mg/dL (ref 8–27)
Bilirubin Total: 0.4 mg/dL (ref 0.0–1.2)
CO2: 23 mmol/L (ref 20–29)
Calcium: 8.7 mg/dL (ref 8.6–10.2)
Chloride: 108 mmol/L — ABNORMAL HIGH (ref 96–106)
Creatinine, Ser: 1.06 mg/dL (ref 0.76–1.27)
Globulin, Total: 2.5 g/dL (ref 1.5–4.5)
Glucose: 81 mg/dL (ref 70–99)
Potassium: 4.2 mmol/L (ref 3.5–5.2)
Sodium: 144 mmol/L (ref 134–144)
Total Protein: 6.5 g/dL (ref 6.0–8.5)
eGFR: 71 mL/min/{1.73_m2} (ref >59)

## 2021-08-22 LAB — CBC
Hematocrit: 48.3 % (ref 37.5–51.0)
Hemoglobin: 16 g/dL (ref 13.0–17.7)
MCH: 29.6 pg (ref 26.6–33.0)
MCHC: 33.1 g/dL (ref 31.5–35.7)
MCV: 89 fL (ref 79–97)
Platelets: 195 10*3/uL (ref 150–450)
RBC: 5.41 x10E6/uL (ref 4.14–5.80)
RDW: 12.7 % (ref 11.6–15.4)
WBC: 7.2 10*3/uL (ref 3.4–10.8)

## 2021-08-22 LAB — TSH: TSH: 6.19 u[IU]/mL — ABNORMAL HIGH (ref 0.450–4.500)

## 2021-08-24 NOTE — Progress Notes (Signed)
Seen by patient Ander Gaster on 08/24/2021 11:31 AM

## 2021-08-25 NOTE — Progress Notes (Addendum)
Office Visit    Patient Name: Brandon Porter Date of Encounter: 08/27/2021  PCP:  Mayra Neer, [place]  Cardiologist:  Buford Dresser, MD  Advanced Practice Provider:  No care team member to display Electrophysiologist:  None    Chief Complaint    Brandon LARICCIA is a 81 y.o. male with a hx of hyperlipidemia presents today for follow-up after establishing care with Dr. Harrell Gave.  Past Medical History    Past Medical History:  Diagnosis Date   Hyperlipidemia    Past Surgical History:  Procedure Laterality Date   CHOLECYSTECTOMY      Allergies  No Known Allergies  History of Present Illness    Brandon Porter is a 81 y.o. male with a hx of hyperlipidemia presents today for follow-up after establishing care with Dr. Harrell Gave.  When he was last seen by Dr. Harrell Gave 06/2021 he was monitoring his blood pressure regularly and in general was not worried about his cardiac health.  He did report his atrial fibrillation was new and occurred twice.  Since then his atrial fibrillation has not occurred and he was overall doing well.  He uses a cardia mobile to help monitor his atrial fibrillation.  Initially he was started on anticoagulation however, he noticed that his navigational thought process while driving was interrupted by brain fog which was thought to be due to to his blood thinner.  Occasionally, he would bleed very easily with minor cuts.  He also makes an effort to stay physically active walking a couple of miles per day and participating in gym sessions for 30 minutes.  He usually does weightlifting 3 times a week.  He was also recently hiking at 10K feet elevation and did not experience any symptoms.  He called our office 08/20/2021  with persistent atrial fibrillation.  He was recommended to take metoprolol succinate 12.5 mg tab daily until he returns to normal sinus rhythm.  We also resumed Eliquis 5 mg twice daily  for stroke prevention.  Follow-up was arranged.  Today, he states that he stays in atrial fibrillation but does not have any symptoms.  He wants to resume his exercise routine which is walking about 2 miles per day in the park and doing body weight exercises.  I stated this would be fine to continue.  We also discussed his medications.  He is compliant with his Eliquis and Toprol-XL.  He is not having any signs of bleeding on his Eliquis.  His blood pressure is well controlled today and he is in rate controlled atrial fibrillation.  He also mentioned that he would like to start Viagra at some point.  I cautioned him to monitor his blood pressure closely since he is also on metoprolol.  He will need a lipid panel drawn in March when he sees his PCP.  His LDL is 161 which is not at goal.  We discussed LDL should be less than 100.  Otherwise, I think he is doing really well today.  Reports no shortness of breath nor dyspnea on exertion. Reports no chest pain, pressure, or tightness. No edema, orthopnea, PND. Reports no palpitations.     EKGs/Labs/Other Studies Reviewed:   The following studies were reviewed today:  02/19/2019 Long term monitor  Patient had a min HR of 41 bpm, max HR of 197 bpm, and avg HR of 74 bpm. Predominant underlying rhythm was Sinus Rhythm. First Degree AV Block was present. Bundle Branch Block/IVCD  was present. 3 Ventricular Tachycardia runs occurred, the run with the fastest interval lasting 4 beats with a max rate of 197 bpm, the longest lasting 5 beats with an avg rate of 112 bpm. 20 Supraventricular Tachycardia runs occurred, the run with the fastest interval lasting 6 beats with a max rate of 174 bpm, the longest lasting 17.1 secs with an avg rate of 92 bpm. Idioventricular Rhythm was present. Isolated SVEs were rare (<1.0%), SVE Couplets were rare (<1.0%), and SVE Triplets were rare (<1.0%). Isolated VEs were occasional (3.6%, 7401), VE Couplets were rare (<1.0%, 614),  and VE Triplets were rare (<1.0%, 16). Ventricular Bigeminy and Trigeminy were present.  Echo 02/14/2019:  1. The left ventricle has low normal systolic function, with an ejection  fraction of 50-55%. The cavity size was mildly dilated. Left ventricular  diastolic Doppler parameters are consistent with impaired relaxation. No  evidence of left ventricular  regional wall motion abnormalities.   2. The right ventricle has normal systolic function. The cavity was  normal. There is no increase in right ventricular wall thickness. Right  ventricular systolic pressure could not be assessed.   3. Left atrial size was mildly dilated.   4. Right atrial size was mildly dilated.   5. The aortic root and ascending aorta are normal in size and structure.    SUMMARY  Frequent PVCs occurred during the study.    Nuclear Stress Myoview 11/07/2018: COMPARISON:  Chest radiograph-11/07/2018; chest CT-07/08/2009   FINDINGS: Raw images: GI attenuation is seen on both provided rest stress images. There is no significant patient motion artifact. There is no significant chest wall attenuation.   Perfusion: There is a minimal amount of attenuation involving the inferior wall left ventricle which resolves on the provided stress images. There is a grossly matched area of attenuation involving the apex of the left ventricle which is without associated regional wall motion abnormality. No definitive scintigraphic evidence of prior infarction or pharmacologically induced ischemia.   Wall Motion: Mild global hypokinesia with relative akinesia involving the septum of the left ventricle.   Left Ventricular Ejection Fraction: 43 %   End diastolic volume 300 ml   End systolic volume 85 ml   IMPRESSION: 1. No definitive scintigraphic evidence of prior infarction pharmacologically induced ischemia.   2. Mild global hypokinesia with relative akinesia involving the septum.   3. Left ventricular ejection  fraction - 43%    EKG:  EKG is  ordered today.  The ekg ordered today demonstrates atrial fibrillation, rate controlled 81 bpm, right bundle branch block (this is not new.)  Recent Labs: 08/21/2021: ALT 13; BUN 17; Creatinine, Ser 1.06; Hemoglobin 16.0; Platelets 195; Potassium 4.2; Sodium 144; TSH 6.190  Recent Lipid Panel    Component Value Date/Time   CHOL 175 11/07/2018 0649   TRIG 75 11/07/2018 0649   HDL 41 11/07/2018 0649   CHOLHDL 4.3 11/07/2018 0649   VLDL 15 11/07/2018 0649   LDLCALC 119 (H) 11/07/2018 0649    Risk Assessment/Calculations:   CHA2DS2-VASc Score = 2   This indicates a 2.2% annual risk of stroke. The patient's score is based upon: CHF History: 0 HTN History: 0 Diabetes History: 0 Stroke History: 0 Vascular Disease History: 0 Age Score: 2 Gender Score: 0     Home Medications   Current Meds  Medication Sig   acetaminophen (TYLENOL) 500 MG tablet Take 500 mg by mouth every 6 (six) hours as needed for mild pain or moderate pain.   apixaban (  ELIQUIS) 5 MG TABS tablet Take 1 tablet (5 mg total) by mouth 2 (two) times daily.   ibuprofen (ADVIL) 200 MG tablet Take 200 mg by mouth every 6 (six) hours as needed for fever, headache, mild pain, moderate pain or cramping.   metoprolol succinate (TOPROL XL) 25 MG 24 hr tablet Take 0.5 tablets (12.5 mg total) by mouth as needed (for palpitations).     Review of Systems      All other systems reviewed and are otherwise negative except as noted above.  Physical Exam    VS:  BP 120/80 (BP Location: Left Arm, Patient Position: Sitting, Cuff Size: Normal)    Pulse 85    Ht 6\' 3"  (1.905 m)    Wt 202 lb (91.6 kg)    SpO2 97%    BMI 25.25 kg/m  , BMI Body mass index is 25.25 kg/m.  Wt Readings from Last 3 Encounters:  08/27/21 202 lb (91.6 kg)  06/23/21 205 lb 3.2 oz (93.1 kg)  04/03/21 213 lb (96.6 kg)     GEN: Well nourished, well developed, in no acute distress. HEENT: normal. Neck: Supple, no JVD,  carotid bruits, or masses. Cardiac: irregularly irregular, no murmurs, rubs, or gallops. No clubbing, cyanosis, edema.  Radials/PT 2+ and equal bilaterally.  Respiratory:  Respirations regular and unlabored, clear to auscultation bilaterally. GI: Soft, nontender, nondistended. MS: No deformity or atrophy. Skin: Warm and dry, no rash. Neuro:  Strength and sensation are intact. Psych: Normal affect.  Assessment & Plan    Paroxysmal atrial fibrillation -Recently started on metoprolol succinate 12.5 mg -Recently started on Eliquis for stroke prevention  Palpitations -resolved  Cardiac risk counseling -Discussed exercise and diet  -Patient continues to loose weight with these lifestyle   Disposition: Follow up 1 year with Buford Dresser, MD or APP.  Signed, Elgie Collard, PA-C 08/27/2021, 9:27 AM [facility]

## 2021-08-27 ENCOUNTER — Ambulatory Visit (INDEPENDENT_AMBULATORY_CARE_PROVIDER_SITE_OTHER): Payer: Medicare Other | Admitting: Physician Assistant

## 2021-08-27 ENCOUNTER — Encounter (HOSPITAL_BASED_OUTPATIENT_CLINIC_OR_DEPARTMENT_OTHER): Payer: Self-pay | Admitting: Physician Assistant

## 2021-08-27 ENCOUNTER — Other Ambulatory Visit: Payer: Self-pay

## 2021-08-27 VITALS — BP 120/80 | HR 85 | Ht 75.0 in | Wt 202.0 lb

## 2021-08-27 DIAGNOSIS — Z79899 Other long term (current) drug therapy: Secondary | ICD-10-CM | POA: Diagnosis not present

## 2021-08-27 DIAGNOSIS — I480 Paroxysmal atrial fibrillation: Secondary | ICD-10-CM | POA: Diagnosis not present

## 2021-08-27 DIAGNOSIS — R002 Palpitations: Secondary | ICD-10-CM

## 2021-08-27 MED ORDER — APIXABAN 5 MG PO TABS: 5.0000 mg | ORAL_TABLET | Freq: Two times a day (BID) | ORAL | 5 refills | Status: DC

## 2021-08-27 NOTE — Patient Instructions (Signed)
Medication Instructions:  Your Physician recommend you continue on your current medication as directed.    *If you need a refill on your cardiac medications before your next appointment, please call your pharmacy*   Lab Work: Please get a lipid panel in March with your primary care provider   If you have labs (blood work) drawn today and your tests are completely normal, you will receive your results only by: [place] (if you have MyChart) OR A paper copy in the mail If you have any lab test that is abnormal or we need to change your treatment, we will call you to review the results.   Testing/Procedures: None ordered today    Follow-Up: At [facility], you and your health needs are our priority.  As part of our continuing mission to provide you with exceptional heart care, we have created designated Provider Care Teams.  These Care Teams include your primary Cardiologist (physician) and Advanced Practice Providers (APPs -  Physician Assistants and Nurse Practitioners) who all work together to provide you with the care you need, when you need it.  We recommend signing up for the patient portal called "MyChart".  Sign up information is provided on this After Visit Summary.  MyChart is used to connect with patients for Virtual Visits (Telemedicine).  Patients are able to view lab/test results, encounter notes, upcoming appointments, etc.  Non-urgent messages can be sent to your provider as well.   To learn more about what you can do with MyChart, go to [email or web address].    Your next appointment:   1 year(s)  The format for your next appointment:   In Person  Provider:   Buford Dresser, MD, Laurann Montana, NP, or Coletta Memos, NP

## 2021-09-01 ENCOUNTER — Encounter (HOSPITAL_BASED_OUTPATIENT_CLINIC_OR_DEPARTMENT_OTHER): Payer: Self-pay

## 2021-09-01 DIAGNOSIS — I4891 Unspecified atrial fibrillation: Secondary | ICD-10-CM

## 2021-09-01 NOTE — Telephone Encounter (Signed)
Would Eliquis not be the same co-pay after $500 deductible? Or plan specific?  Loel Dubonnet, NP

## 2021-09-02 MED ORDER — RIVAROXABAN 20 MG PO TABS: 20.0000 mg | ORAL_TABLET | Freq: Every day | ORAL | 11 refills | Status: DC

## 2021-09-29 DIAGNOSIS — N3020 Other chronic cystitis without hematuria: Secondary | ICD-10-CM | POA: Diagnosis not present

## 2021-09-29 DIAGNOSIS — N312 Flaccid neuropathic bladder, not elsewhere classified: Secondary | ICD-10-CM | POA: Diagnosis not present

## 2021-09-29 DIAGNOSIS — N5201 Erectile dysfunction due to arterial insufficiency: Secondary | ICD-10-CM | POA: Diagnosis not present

## 2021-10-02 DIAGNOSIS — H6693 Otitis media, unspecified, bilateral: Secondary | ICD-10-CM | POA: Diagnosis not present

## 2021-10-08 DIAGNOSIS — H60333 Swimmer's ear, bilateral: Secondary | ICD-10-CM | POA: Diagnosis not present

## 2021-10-18 ENCOUNTER — Encounter (HOSPITAL_BASED_OUTPATIENT_CLINIC_OR_DEPARTMENT_OTHER): Payer: Self-pay

## 2021-10-19 NOTE — Telephone Encounter (Signed)
Kardia readings for review  ?

## 2021-10-22 DIAGNOSIS — H6093 Unspecified otitis externa, bilateral: Secondary | ICD-10-CM | POA: Diagnosis not present

## 2021-11-04 DIAGNOSIS — Z20822 Contact with and (suspected) exposure to covid-19: Secondary | ICD-10-CM | POA: Diagnosis not present

## 2021-11-05 DIAGNOSIS — Z20822 Contact with and (suspected) exposure to covid-19: Secondary | ICD-10-CM | POA: Diagnosis not present

## 2021-11-05 DIAGNOSIS — Z23 Encounter for immunization: Secondary | ICD-10-CM | POA: Diagnosis not present

## 2021-11-05 DIAGNOSIS — E7800 Pure hypercholesterolemia, unspecified: Secondary | ICD-10-CM | POA: Diagnosis not present

## 2021-11-05 DIAGNOSIS — M542 Cervicalgia: Secondary | ICD-10-CM | POA: Diagnosis not present

## 2021-11-05 DIAGNOSIS — I4891 Unspecified atrial fibrillation: Secondary | ICD-10-CM | POA: Diagnosis not present

## 2021-11-05 DIAGNOSIS — D6869 Other thrombophilia: Secondary | ICD-10-CM | POA: Diagnosis not present

## 2021-11-05 DIAGNOSIS — Z0000 Encounter for general adult medical examination without abnormal findings: Secondary | ICD-10-CM | POA: Diagnosis not present

## 2021-11-05 DIAGNOSIS — N319 Neuromuscular dysfunction of bladder, unspecified: Secondary | ICD-10-CM | POA: Diagnosis not present

## 2021-11-05 DIAGNOSIS — R972 Elevated prostate specific antigen [PSA]: Secondary | ICD-10-CM | POA: Diagnosis not present

## 2021-11-11 DIAGNOSIS — Z20822 Contact with and (suspected) exposure to covid-19: Secondary | ICD-10-CM | POA: Diagnosis not present

## 2021-11-12 DIAGNOSIS — M25511 Pain in right shoulder: Secondary | ICD-10-CM | POA: Diagnosis not present

## 2021-11-12 DIAGNOSIS — M542 Cervicalgia: Secondary | ICD-10-CM | POA: Diagnosis not present

## 2021-12-04 DIAGNOSIS — Z20822 Contact with and (suspected) exposure to covid-19: Secondary | ICD-10-CM | POA: Diagnosis not present

## 2022-01-14 DIAGNOSIS — Z961 Presence of intraocular lens: Secondary | ICD-10-CM | POA: Diagnosis not present

## 2022-01-14 DIAGNOSIS — H43813 Vitreous degeneration, bilateral: Secondary | ICD-10-CM | POA: Diagnosis not present

## 2022-01-27 DIAGNOSIS — C44519 Basal cell carcinoma of skin of other part of trunk: Secondary | ICD-10-CM | POA: Diagnosis not present

## 2022-01-27 DIAGNOSIS — Z85828 Personal history of other malignant neoplasm of skin: Secondary | ICD-10-CM | POA: Diagnosis not present

## 2022-01-27 DIAGNOSIS — L820 Inflamed seborrheic keratosis: Secondary | ICD-10-CM | POA: Diagnosis not present

## 2022-01-27 DIAGNOSIS — B351 Tinea unguium: Secondary | ICD-10-CM | POA: Diagnosis not present

## 2022-01-27 DIAGNOSIS — C44529 Squamous cell carcinoma of skin of other part of trunk: Secondary | ICD-10-CM | POA: Diagnosis not present

## 2022-01-27 DIAGNOSIS — L578 Other skin changes due to chronic exposure to nonionizing radiation: Secondary | ICD-10-CM | POA: Diagnosis not present

## 2022-01-27 DIAGNOSIS — L570 Actinic keratosis: Secondary | ICD-10-CM | POA: Diagnosis not present

## 2022-01-27 DIAGNOSIS — D485 Neoplasm of uncertain behavior of skin: Secondary | ICD-10-CM | POA: Diagnosis not present

## 2022-01-27 DIAGNOSIS — D225 Melanocytic nevi of trunk: Secondary | ICD-10-CM | POA: Diagnosis not present

## 2022-04-08 DIAGNOSIS — D045 Carcinoma in situ of skin of trunk: Secondary | ICD-10-CM | POA: Diagnosis not present

## 2022-04-08 DIAGNOSIS — C44521 Squamous cell carcinoma of skin of breast: Secondary | ICD-10-CM | POA: Diagnosis not present

## 2022-05-31 ENCOUNTER — Other Ambulatory Visit: Payer: Self-pay | Admitting: Medical

## 2022-06-23 DIAGNOSIS — M25551 Pain in right hip: Secondary | ICD-10-CM | POA: Diagnosis not present

## 2022-07-06 DIAGNOSIS — L570 Actinic keratosis: Secondary | ICD-10-CM | POA: Diagnosis not present

## 2022-07-06 DIAGNOSIS — D485 Neoplasm of uncertain behavior of skin: Secondary | ICD-10-CM | POA: Diagnosis not present

## 2022-07-06 DIAGNOSIS — D225 Melanocytic nevi of trunk: Secondary | ICD-10-CM | POA: Diagnosis not present

## 2022-07-22 ENCOUNTER — Encounter (HOSPITAL_BASED_OUTPATIENT_CLINIC_OR_DEPARTMENT_OTHER): Payer: Self-pay

## 2022-07-22 DIAGNOSIS — D6869 Other thrombophilia: Secondary | ICD-10-CM

## 2022-07-26 MED ORDER — RIVAROXABAN 20 MG PO TABS: 20.0000 mg | ORAL_TABLET | Freq: Every day | ORAL | 1 refills | Status: DC

## 2022-07-26 NOTE — Telephone Encounter (Signed)
Received request from [facility] to send in [place] prescription for [facility] select program, this has been done.

## 2022-07-29 ENCOUNTER — Telehealth: Payer: Self-pay | Admitting: Cardiology

## 2022-07-29 DIAGNOSIS — I4891 Unspecified atrial fibrillation: Secondary | ICD-10-CM

## 2022-07-29 MED ORDER — RIVAROXABAN 20 MG PO TABS: 20.0000 mg | ORAL_TABLET | Freq: Every day | ORAL | 0 refills | Status: DC

## 2022-07-29 NOTE — Telephone Encounter (Signed)
*  STAT* If patient is at the pharmacy, call can be transferred to refill team.   1. Which medications need to be refilled? (please list name of each medication and dose if known) rivaroxaban (XARELTO) 20 MG TABS tablet   2. Which pharmacy/location (including street and city if local pharmacy) is medication to be sent to? [place], [place] [facility]   3. Do they need a 30 day or 90 day supply? 90 day  [facility] states they did not receive the refill. Verified fax number is: [phone]. They need the refill sent within a week for their program or the patient will have to wait until April.

## 2022-07-29 NOTE — Telephone Encounter (Signed)
Please review for refill. Thank you! 

## 2022-08-26 DIAGNOSIS — C44212 Basal cell carcinoma of skin of right ear and external auricular canal: Secondary | ICD-10-CM | POA: Diagnosis not present

## 2022-08-26 DIAGNOSIS — L570 Actinic keratosis: Secondary | ICD-10-CM | POA: Diagnosis not present

## 2022-08-26 DIAGNOSIS — D485 Neoplasm of uncertain behavior of skin: Secondary | ICD-10-CM | POA: Diagnosis not present

## 2022-08-26 DIAGNOSIS — L821 Other seborrheic keratosis: Secondary | ICD-10-CM | POA: Diagnosis not present

## 2022-08-26 DIAGNOSIS — C44311 Basal cell carcinoma of skin of nose: Secondary | ICD-10-CM | POA: Diagnosis not present

## 2022-08-26 DIAGNOSIS — L578 Other skin changes due to chronic exposure to nonionizing radiation: Secondary | ICD-10-CM | POA: Diagnosis not present

## 2022-08-26 DIAGNOSIS — D225 Melanocytic nevi of trunk: Secondary | ICD-10-CM | POA: Diagnosis not present

## 2022-08-26 DIAGNOSIS — Z86018 Personal history of other benign neoplasm: Secondary | ICD-10-CM | POA: Diagnosis not present

## 2022-08-26 DIAGNOSIS — Z85828 Personal history of other malignant neoplasm of skin: Secondary | ICD-10-CM | POA: Diagnosis not present

## 2022-09-23 DIAGNOSIS — C44319 Basal cell carcinoma of skin of other parts of face: Secondary | ICD-10-CM | POA: Diagnosis not present

## 2022-09-24 ENCOUNTER — Other Ambulatory Visit (HOSPITAL_BASED_OUTPATIENT_CLINIC_OR_DEPARTMENT_OTHER): Payer: Self-pay | Admitting: Family

## 2022-09-24 DIAGNOSIS — I480 Paroxysmal atrial fibrillation: Secondary | ICD-10-CM

## 2022-09-24 DIAGNOSIS — I4891 Unspecified atrial fibrillation: Secondary | ICD-10-CM

## 2022-09-27 ENCOUNTER — Other Ambulatory Visit (HOSPITAL_BASED_OUTPATIENT_CLINIC_OR_DEPARTMENT_OTHER): Payer: Self-pay | Admitting: Family

## 2022-09-27 DIAGNOSIS — D6869 Other thrombophilia: Secondary | ICD-10-CM

## 2022-09-27 NOTE — Telephone Encounter (Signed)
Please see message below from [facility] concerning Xarelto refill. Pt scheduled. Thank you!

## 2022-09-27 NOTE — Telephone Encounter (Signed)
Scheduled 09/29/22

## 2022-09-27 NOTE — Telephone Encounter (Signed)
Xarelto 76m refill request received. Pt is 82years old, weight-91.6kg, Crea-1.06 on 08/21/21-needs labs, last seen by TNicholes Roughon 08/27/21 and has an appt pending on 09/29/22 with Dr. CHarrell Gave Diagnosis-Afib, CrCl-72.085mmin; Dose is appropriate based on dosing criteria. Will send in a 30 day supply refill to requested pharmacy. Will place a note on appt that pt needs updated labs.

## 2022-09-27 NOTE — Telephone Encounter (Signed)
Please review for refill. Thank you! 

## 2022-09-27 NOTE — Telephone Encounter (Signed)
Left message for patient to call and schedule overdue follow u with Dr. Harrell Gave / APP for medication refills

## 2022-09-29 ENCOUNTER — Ambulatory Visit (INDEPENDENT_AMBULATORY_CARE_PROVIDER_SITE_OTHER): Payer: Medicare Other | Admitting: Cardiology

## 2022-09-29 ENCOUNTER — Encounter (HOSPITAL_BASED_OUTPATIENT_CLINIC_OR_DEPARTMENT_OTHER): Payer: Self-pay | Admitting: Cardiology

## 2022-09-29 VITALS — BP 122/78 | HR 83 | Ht 75.0 in | Wt 209.5 lb

## 2022-09-29 DIAGNOSIS — D6869 Other thrombophilia: Secondary | ICD-10-CM

## 2022-09-29 DIAGNOSIS — Z7189 Other specified counseling: Secondary | ICD-10-CM | POA: Diagnosis not present

## 2022-09-29 DIAGNOSIS — I4819 Other persistent atrial fibrillation: Secondary | ICD-10-CM

## 2022-09-29 DIAGNOSIS — Z7901 Long term (current) use of anticoagulants: Secondary | ICD-10-CM | POA: Diagnosis not present

## 2022-09-29 MED ORDER — RIVAROXABAN 20 MG PO TABS: 20.0000 mg | ORAL_TABLET | Freq: Every day | ORAL | 3 refills | Status: DC

## 2022-09-29 NOTE — Progress Notes (Signed)
Cardiology Office Note:    Date:  09/29/2022   ID:  ANABEL HOLLERBACH, DOB 01-01-1941, MRN [id number]  PCP:  Mayra Neer, MD  Cardiologist:  Buford Dresser, MD  Referring MD: Mayra Neer, MD   CC: follow up   History of Present Illness:    Brandon Porter is a 82 y.o. male with a hx of atrial fibrillation, hyperlipidemia who is seen for follow-up.  Today, he is doing well overall.   He continues to be in A-Fib and receives alerts on his Apple Watch at least once a day. He had a period about 1-2 months ago where he felt fine. He notes that he tends not to know when he is in A-Fib other than because his watch sends an alert. He reports that he went to [place] about a year ago and was at an altitude of 10,000 feet and had no problem.   He endorses having a bleeding problem. He self caths due to neurogenic bladder, and he had an incident last year when he had significant hematuria. This resolved after a few days of antibiotics.   He has started going to back to the gym and walking on the treadmill for about 30 minutes. He reports no limitations other than a bit of trouble walking up hills while on vacation in [place] recently.  He is going on vacation for 2 months in April. He has been compliant with Xarelto 20 mg daily.   He denies any palpitations, chest pain, shortness of breath, or peripheral edema. No lightheadedness, headaches, syncope, orthopnea, or PND.  Past Medical History:  Diagnosis Date   Hyperlipidemia     Past Surgical History:  Procedure Laterality Date   CHOLECYSTECTOMY      Current Medications: Current Outpatient Medications on File Prior to Visit  Medication Sig   acetaminophen (TYLENOL) 500 MG tablet Take 500 mg by mouth every 6 (six) hours as needed for mild pain or moderate pain.   amoxicillin-clavulanate (AUGMENTIN) 875-125 MG tablet Take 1 tablet by mouth 2 (two) times daily.   metoprolol succinate (TOPROL-XL) 25 MG 24 hr tablet TAKE  0.5 TABLETS (12.5 MG TOTAL) BY MOUTH AS NEEDED (FOR PALPITATIONS).   No current facility-administered medications on file prior to visit.     Allergies:   Patient has no known allergies.   Social History   Tobacco Use   Smoking status: Former   Smokeless tobacco: Never    Family History: family history includes CAD in his brother and father.  ROS:   Please see the history of present illness.  All other systems are reviewed and negative.    EKGs/Labs/Other Studies Reviewed:    The following studies were reviewed today:  Monitor 02/2019: Sinus bradycardia, NSR, sinus tachycardia, PACs, brief PAT, PVCs and 5 beats NSVT  Echo 02/14/2019:  1. The left ventricle has low normal systolic function, with an ejection  fraction of 50-55%. The cavity size was mildly dilated. Left ventricular  diastolic Doppler parameters are consistent with impaired relaxation. No  evidence of left ventricular  regional wall motion abnormalities.   2. The right ventricle has normal systolic function. The cavity was  normal. There is no increase in right ventricular wall thickness. Right  ventricular systolic pressure could not be assessed.   3. Left atrial size was mildly dilated.   4. Right atrial size was mildly dilated.   5. The aortic root and ascending aorta are normal in size and structure.   SUMMARY  Frequent PVCs  occurred during the study.   Nuclear Stress Myoview 11/07/2018: COMPARISON:  Chest radiograph-11/07/2018; chest CT-07/08/2009   FINDINGS: Raw images: GI attenuation is seen on both provided rest stress images. There is no significant patient motion artifact. There is no significant chest wall attenuation.   Perfusion: There is a minimal amount of attenuation involving the inferior wall left ventricle which resolves on the provided stress images. There is a grossly matched area of attenuation involving the apex of the left ventricle which is without associated regional  wall motion abnormality. No definitive scintigraphic evidence of prior infarction or pharmacologically induced ischemia.   Wall Motion: Mild global hypokinesia with relative akinesia involving the septum of the left ventricle.   Left Ventricular Ejection Fraction: 43 %   End diastolic volume [id number] ml   End systolic volume 85 ml   IMPRESSION: 1. No definitive scintigraphic evidence of prior infarction pharmacologically induced ischemia.   2. Mild global hypokinesia with relative akinesia involving the septum.   3. Left ventricular ejection fraction - 43%  EKG:  EKG is personally reviewed.   09/29/2022: atrial fibrillation at 83 bpm, PVC vs aberrantly conducted complex. RBBB 06/23/2021: NSR, RBBB, PVC's at 72 bpm  Recent Labs: No results found for requested labs within last 365 days.   Recent Lipid Panel    Component Value Date/Time   CHOL 175 11/07/2018 0649   TRIG 75 11/07/2018 0649   HDL 41 11/07/2018 0649   CHOLHDL 4.3 11/07/2018 0649   VLDL 15 11/07/2018 0649   LDLCALC 119 (H) 11/07/2018 0649    Physical Exam:    VS:  BP 122/78 (BP Location: Left Arm, Patient Position: Sitting, Cuff Size: Normal)   Pulse 83   Ht 6' 3"$  (1.905 m)   Wt 209 lb 8 oz (95 kg)   BMI 26.19 kg/m     Wt Readings from Last 3 Encounters:  09/29/22 209 lb 8 oz (95 kg)  08/27/21 202 lb (91.6 kg)  06/23/21 205 lb 3.2 oz (93.1 kg)    GEN: Well nourished, well developed in no acute distress GEN: Well nourished, well developed in no acute distress HEENT: Normal, moist mucous membranes NECK: No JVD CARDIAC: irregularly irregular rhythm, normal S1 and S2, no rubs or gallops. No murmur. VASCULAR: Radial and DP pulses 2+ bilaterally. No carotid bruits RESPIRATORY:  Clear to auscultation without rales, wheezing or rhonchi  ABDOMEN: Soft, non-tender, non-distended MUSCULOSKELETAL:  Ambulates independently SKIN: Warm and dry, no edema NEUROLOGIC:  Alert and oriented x 3. No focal neuro deficits  noted. PSYCHIATRIC:  Normal affect     ASSESSMENT:    1. Persistent atrial fibrillation ([place])   2. Hypercoagulable state due to atrial fibrillation ([place])   3. Long term current use of anticoagulant   4. Cardiac risk counseling   5. Counseling on health promotion and disease prevention     PLAN:    Persistent atrial fibrillation -chadsvasc=2, on rivaroxaban -uses metoprolol PRN with good success in management. Felt foggy with it before, prefers to take only as needed -single episode of hematuria -discussed cardioversion today. He feels no different in afib than in sinus. He declines cardioversion at this time but will contact me if he changes his mind  Cardiac risk counseling and prevention recommendations: -recommend heart healthy/Mediterranean diet, with whole grains, fruits, vegetable, fish, lean meats, nuts, and olive oil. Limit salt. -recommend moderate walking, 3-5 times/week for 30-50 minutes each session. Aim for at least 150 minutes.week. Goal should be pace of 3 miles/hours,  or walking 1.5 miles in 30 minutes -recommend avoidance of tobacco products. Avoid excess alcohol. -ASCVD risk score: elevated but above the age recommended for primary prevention  Plan for follow up: 1 year or sooner as needed.   Buford Dresser, MD, PhD, [facility]    Medication Adjustments/Labs and Tests Ordered: Current medicines are reviewed at length with the patient today.  Concerns regarding medicines are outlined above.   Orders Placed This Encounter  Procedures   EKG 12-Lead    Meds ordered this encounter  Medications   rivaroxaban (XARELTO) 20 MG TABS tablet    Sig: Take 1 tablet (20 mg total) by mouth daily with supper.    Dispense:  90 tablet    Refill:  3    Afib    Patient Instructions  Medication Instructions:  Your physician recommends that you continue on your current medications as directed. Please refer to the Current Medication list  given to you today.  *If you need a refill on your cardiac medications before your next appointment, please call your pharmacy*  Follow-Up: At [facility], you and your health needs are our priority.  As part of our continuing mission to provide you with exceptional heart care, we have created designated Provider Care Teams.  These Care Teams include your primary Cardiologist (physician) and Advanced Practice Providers (APPs -  Physician Assistants and Nurse Practitioners) who all work together to provide you with the care you need, when you need it.  We recommend signing up for the patient portal called "MyChart".  Sign up information is provided on this After Visit Summary.  MyChart is used to connect with patients for Virtual Visits (Telemedicine).  Patients are able to view lab/test results, encounter notes, upcoming appointments, etc.  Non-urgent messages can be sent to your provider as well.   To learn more about what you can do with MyChart, go to [email or web address].    Your next appointment:   1 year(s)  Provider:   Buford Dresser, MD      I,Rachel Rivera,acting as a scribe for Buford Dresser, MD.,have documented all relevant documentation on the behalf of Buford Dresser, MD,as directed by  Buford Dresser, MD while in the presence of Buford Dresser, MD.  I, Buford Dresser, MD, have reviewed all documentation for this visit. The documentation on 09/29/22 for the exam, diagnosis, procedures, and orders are all accurate and complete.   Signed, Buford Dresser, MD PhD 09/29/2022 2:49 PM    [place]

## 2022-09-29 NOTE — Patient Instructions (Signed)
Medication Instructions:  Your physician recommends that you continue on your current medications as directed. Please refer to the Current Medication list given to you today.  *If you need a refill on your cardiac medications before your next appointment, please call your pharmacy*  Follow-Up: At [facility], you and your health needs are our priority.  As part of our continuing mission to provide you with exceptional heart care, we have created designated Provider Care Teams.  These Care Teams include your primary Cardiologist (physician) and Advanced Practice Providers (APPs -  Physician Assistants and Nurse Practitioners) who all work together to provide you with the care you need, when you need it.  We recommend signing up for the patient portal called "MyChart".  Sign up information is provided on this After Visit Summary.  MyChart is used to connect with patients for Virtual Visits (Telemedicine).  Patients are able to view lab/test results, encounter notes, upcoming appointments, etc.  Non-urgent messages can be sent to your provider as well.   To learn more about what you can do with MyChart, go to [email or web address].    Your next appointment:   1 year(s)  Provider:   Buford Dresser, MD

## 2022-10-01 DIAGNOSIS — N5201 Erectile dysfunction due to arterial insufficiency: Secondary | ICD-10-CM | POA: Diagnosis not present

## 2022-10-01 DIAGNOSIS — R338 Other retention of urine: Secondary | ICD-10-CM | POA: Diagnosis not present

## 2022-10-01 DIAGNOSIS — N312 Flaccid neuropathic bladder, not elsewhere classified: Secondary | ICD-10-CM | POA: Diagnosis not present

## 2022-10-07 DIAGNOSIS — C44319 Basal cell carcinoma of skin of other parts of face: Secondary | ICD-10-CM | POA: Diagnosis not present

## 2022-10-25 ENCOUNTER — Encounter (HOSPITAL_BASED_OUTPATIENT_CLINIC_OR_DEPARTMENT_OTHER): Payer: Self-pay

## 2022-11-15 DIAGNOSIS — D6869 Other thrombophilia: Secondary | ICD-10-CM | POA: Diagnosis not present

## 2022-11-15 DIAGNOSIS — E7800 Pure hypercholesterolemia, unspecified: Secondary | ICD-10-CM | POA: Diagnosis not present

## 2022-11-15 DIAGNOSIS — Z0000 Encounter for general adult medical examination without abnormal findings: Secondary | ICD-10-CM | POA: Diagnosis not present

## 2022-11-15 DIAGNOSIS — I4891 Unspecified atrial fibrillation: Secondary | ICD-10-CM | POA: Diagnosis not present

## 2022-11-15 DIAGNOSIS — R946 Abnormal results of thyroid function studies: Secondary | ICD-10-CM | POA: Diagnosis not present

## 2022-11-15 DIAGNOSIS — N319 Neuromuscular dysfunction of bladder, unspecified: Secondary | ICD-10-CM | POA: Diagnosis not present

## 2023-01-14 DIAGNOSIS — E039 Hypothyroidism, unspecified: Secondary | ICD-10-CM | POA: Diagnosis not present

## 2023-02-09 DIAGNOSIS — L570 Actinic keratosis: Secondary | ICD-10-CM | POA: Diagnosis not present

## 2023-02-09 DIAGNOSIS — D225 Melanocytic nevi of trunk: Secondary | ICD-10-CM | POA: Diagnosis not present

## 2023-02-09 DIAGNOSIS — D485 Neoplasm of uncertain behavior of skin: Secondary | ICD-10-CM | POA: Diagnosis not present

## 2023-02-09 DIAGNOSIS — L578 Other skin changes due to chronic exposure to nonionizing radiation: Secondary | ICD-10-CM | POA: Diagnosis not present

## 2023-02-09 DIAGNOSIS — Z86018 Personal history of other benign neoplasm: Secondary | ICD-10-CM | POA: Diagnosis not present

## 2023-02-09 DIAGNOSIS — Z85828 Personal history of other malignant neoplasm of skin: Secondary | ICD-10-CM | POA: Diagnosis not present

## 2023-02-09 DIAGNOSIS — D2272 Melanocytic nevi of left lower limb, including hip: Secondary | ICD-10-CM | POA: Diagnosis not present

## 2023-02-09 DIAGNOSIS — C44311 Basal cell carcinoma of skin of nose: Secondary | ICD-10-CM | POA: Diagnosis not present

## 2023-02-09 DIAGNOSIS — C44319 Basal cell carcinoma of skin of other parts of face: Secondary | ICD-10-CM | POA: Diagnosis not present

## 2023-02-09 DIAGNOSIS — L821 Other seborrheic keratosis: Secondary | ICD-10-CM | POA: Diagnosis not present

## 2023-02-09 DIAGNOSIS — C4441 Basal cell carcinoma of skin of scalp and neck: Secondary | ICD-10-CM | POA: Diagnosis not present

## 2023-02-24 DIAGNOSIS — H43813 Vitreous degeneration, bilateral: Secondary | ICD-10-CM | POA: Diagnosis not present

## 2023-03-02 ENCOUNTER — Other Ambulatory Visit (HOSPITAL_COMMUNITY): Payer: Self-pay

## 2023-03-02 ENCOUNTER — Other Ambulatory Visit (HOSPITAL_BASED_OUTPATIENT_CLINIC_OR_DEPARTMENT_OTHER): Payer: Self-pay

## 2023-03-02 MED ORDER — CIPROFLOXACIN HCL 0.3 % OP SOLN
1.0000 [drp] | Freq: Two times a day (BID) | OPHTHALMIC | 0 refills | Status: DC
  Filled 2023-03-02: qty 2.5, 13d supply, fill #0

## 2023-03-02 MED ORDER — CIPROFLOXACIN HCL 0.2 % OT SOLN
0.2500 mL | Freq: Two times a day (BID) | OTIC | 0 refills | Status: AC
  Filled 2023-03-02 (×2): qty 14, 7d supply, fill #0

## 2023-03-31 DIAGNOSIS — C44319 Basal cell carcinoma of skin of other parts of face: Secondary | ICD-10-CM | POA: Diagnosis not present

## 2023-04-20 DIAGNOSIS — C4441 Basal cell carcinoma of skin of scalp and neck: Secondary | ICD-10-CM | POA: Diagnosis not present

## 2023-05-02 DIAGNOSIS — C44311 Basal cell carcinoma of skin of nose: Secondary | ICD-10-CM | POA: Diagnosis not present

## 2023-05-11 DIAGNOSIS — L570 Actinic keratosis: Secondary | ICD-10-CM | POA: Diagnosis not present

## 2023-05-11 DIAGNOSIS — D485 Neoplasm of uncertain behavior of skin: Secondary | ICD-10-CM | POA: Diagnosis not present

## 2023-06-08 DIAGNOSIS — L080 Pyoderma: Secondary | ICD-10-CM | POA: Diagnosis not present

## 2023-06-08 DIAGNOSIS — L119 Acantholytic disorder, unspecified: Secondary | ICD-10-CM | POA: Diagnosis not present

## 2023-06-08 DIAGNOSIS — C44219 Basal cell carcinoma of skin of left ear and external auricular canal: Secondary | ICD-10-CM | POA: Diagnosis not present

## 2023-06-08 DIAGNOSIS — D485 Neoplasm of uncertain behavior of skin: Secondary | ICD-10-CM | POA: Diagnosis not present

## 2023-06-08 DIAGNOSIS — L570 Actinic keratosis: Secondary | ICD-10-CM | POA: Diagnosis not present

## 2023-06-08 DIAGNOSIS — L578 Other skin changes due to chronic exposure to nonionizing radiation: Secondary | ICD-10-CM | POA: Diagnosis not present

## 2023-06-21 ENCOUNTER — Encounter (HOSPITAL_BASED_OUTPATIENT_CLINIC_OR_DEPARTMENT_OTHER): Payer: Self-pay

## 2023-06-23 ENCOUNTER — Ambulatory Visit (HOSPITAL_BASED_OUTPATIENT_CLINIC_OR_DEPARTMENT_OTHER): Payer: Medicare Other | Admitting: Cardiology

## 2023-06-23 ENCOUNTER — Encounter (HOSPITAL_BASED_OUTPATIENT_CLINIC_OR_DEPARTMENT_OTHER): Payer: Self-pay | Admitting: Cardiology

## 2023-06-23 VITALS — BP 134/66 | HR 70 | Ht 75.0 in | Wt 209.2 lb

## 2023-06-23 DIAGNOSIS — N319 Neuromuscular dysfunction of bladder, unspecified: Secondary | ICD-10-CM | POA: Diagnosis not present

## 2023-06-23 DIAGNOSIS — I4819 Other persistent atrial fibrillation: Secondary | ICD-10-CM | POA: Diagnosis not present

## 2023-06-23 DIAGNOSIS — D6869 Other thrombophilia: Secondary | ICD-10-CM | POA: Diagnosis not present

## 2023-06-23 DIAGNOSIS — Z7901 Long term (current) use of anticoagulants: Secondary | ICD-10-CM | POA: Diagnosis not present

## 2023-06-23 DIAGNOSIS — Z7189 Other specified counseling: Secondary | ICD-10-CM | POA: Diagnosis not present

## 2023-06-23 NOTE — Patient Instructions (Signed)

## 2023-06-23 NOTE — Progress Notes (Signed)
  Cardiology Office Note:  .   Date:  06/23/2023  ID:  [name], DOB 1941/03/27, MRN [id number] PCP: Lupita Raider, MD  [facility] Providers Cardiologist:  Jodelle Red, MD {  History of Present Illness: .   Brandon Porter is a 82 y.o. male with PMH persistent atrial fibrillation, neurogenic bladder with self cath, history of hematuria. I met him 06/23/2021  Today: In general he feels that he is doing well. Brings a Kardia strip with him today which shows afib with PVCs. Has not seen NSR in about 6 mos. irHas not had recurrent significant hematuria with self cathing other than the prior episode in 2023. He has had had several skin cancers removed and has had some bleeding with these on the Xarelto.  We discussed pros/cons of anticoagulation and options at length, see below.  He is staying active, does exercises at home routinely, including cardiovascular, weights, and balance.   ROS: Denies chest pain, shortness of breath at rest or with normal exertion. No PND, orthopnea, LE edema or unexpected weight gain. No syncope or palpitations. ROS otherwise negative except as noted.   Studies Reviewed: Marland Kitchen    EKG:       Physical Exam:   VS:  BP 134/66   Pulse 70   Ht 6\' 3"  (1.905 m)   Wt 209 lb 3.2 oz (94.9 kg)   SpO2 99%   BMI 26.15 kg/m    Wt Readings from Last 3 Encounters:  06/23/23 209 lb 3.2 oz (94.9 kg)  09/29/22 209 lb 8 oz (95 kg)  08/27/21 202 lb (91.6 kg)    GEN: Well nourished, well developed in no acute distress HEENT: Normal, moist mucous membranes NECK: No JVD CARDIAC: irregularly irregular rhythm, normal S1 and S2, no rubs or gallops. No murmur. VASCULAR: Radial and DP pulses 2+ bilaterally. No carotid bruits RESPIRATORY:  Clear to auscultation without rales, wheezing or rhonchi  ABDOMEN: Soft, non-tender, non-distended MUSCULOSKELETAL:  Ambulates independently SKIN: Warm and dry, no edema NEUROLOGIC:  Alert and oriented x 3. No focal  neuro deficits noted. PSYCHIATRIC:  Normal affect    ASSESSMENT AND PLAN: .    Persistent atrial fibrillation -chadsvasc=2, on rivaroxaban -uses metoprolol PRN with good success in management. Felt foggy with it before, prefers to take only as needed -single episode of hematuria -discussed cardioversion today. He feels no different in afib than in sinus. He declines cardioversion at this time but will contact me if he changes his mind -we discussed anticoagulation at length today. Discussed stroke risk, risk of bleeding, and options. Discussed continuing DOAC, stopping DOAC, and referral for Watchman. After shared decision making today, we will continue DOAC for now. If he has another episode of severe bleeding, he would be interested in discussing Watchman   CV risk counseling and prevention -recommend heart healthy/Mediterranean diet, with whole grains, fruits, vegetable, fish, lean meats, nuts, and olive oil. Limit salt. -recommend moderate walking, 3-5 times/week for 30-50 minutes each session. Aim for at least 150 minutes.week. Goal should be pace of 3 miles/hours, or walking 1.5 miles in 30 minutes -recommend avoidance of tobacco products. Avoid excess alcohol.  Dispo: 6 mos or sooner as needed  Signed, Jodelle Red, MD   Jodelle Red, MD, PhD, [facility]  [facility]  [place]  [facility] [address], [address] [place], [place] [address] [phone]

## 2023-07-25 DIAGNOSIS — C44219 Basal cell carcinoma of skin of left ear and external auricular canal: Secondary | ICD-10-CM | POA: Diagnosis not present

## 2023-08-17 DIAGNOSIS — D485 Neoplasm of uncertain behavior of skin: Secondary | ICD-10-CM | POA: Diagnosis not present

## 2023-08-17 DIAGNOSIS — Z86018 Personal history of other benign neoplasm: Secondary | ICD-10-CM | POA: Diagnosis not present

## 2023-08-17 DIAGNOSIS — D045 Carcinoma in situ of skin of trunk: Secondary | ICD-10-CM | POA: Diagnosis not present

## 2023-08-17 DIAGNOSIS — Z85828 Personal history of other malignant neoplasm of skin: Secondary | ICD-10-CM | POA: Diagnosis not present

## 2023-08-17 DIAGNOSIS — D225 Melanocytic nevi of trunk: Secondary | ICD-10-CM | POA: Diagnosis not present

## 2023-08-17 DIAGNOSIS — L821 Other seborrheic keratosis: Secondary | ICD-10-CM | POA: Diagnosis not present

## 2023-08-17 DIAGNOSIS — L570 Actinic keratosis: Secondary | ICD-10-CM | POA: Diagnosis not present

## 2023-08-17 DIAGNOSIS — L578 Other skin changes due to chronic exposure to nonionizing radiation: Secondary | ICD-10-CM | POA: Diagnosis not present

## 2023-08-24 ENCOUNTER — Other Ambulatory Visit (HOSPITAL_BASED_OUTPATIENT_CLINIC_OR_DEPARTMENT_OTHER): Payer: Self-pay

## 2023-10-12 DIAGNOSIS — D045 Carcinoma in situ of skin of trunk: Secondary | ICD-10-CM | POA: Diagnosis not present

## 2023-11-01 ENCOUNTER — Other Ambulatory Visit (HOSPITAL_BASED_OUTPATIENT_CLINIC_OR_DEPARTMENT_OTHER): Payer: Self-pay | Admitting: Cardiology

## 2023-11-01 DIAGNOSIS — Z7901 Long term (current) use of anticoagulants: Secondary | ICD-10-CM

## 2023-11-01 DIAGNOSIS — I4819 Other persistent atrial fibrillation: Secondary | ICD-10-CM

## 2023-11-02 DIAGNOSIS — R338 Other retention of urine: Secondary | ICD-10-CM | POA: Diagnosis not present

## 2023-11-02 DIAGNOSIS — N139 Obstructive and reflux uropathy, unspecified: Secondary | ICD-10-CM | POA: Diagnosis not present

## 2023-11-02 DIAGNOSIS — N312 Flaccid neuropathic bladder, not elsewhere classified: Secondary | ICD-10-CM | POA: Diagnosis not present

## 2023-11-02 DIAGNOSIS — N3020 Other chronic cystitis without hematuria: Secondary | ICD-10-CM | POA: Diagnosis not present

## 2023-11-02 NOTE — Telephone Encounter (Signed)
 Prescription refill request for Xarelto received.  Indication: AF Last office visit: 06/23/23  Ernst Bowler MD Weight: 94.9kg Age: 83 Scr: 1.06 on 08/21/21  Epic   CrCl: 73  Based on above findings Xarelto 20mg  daily is the appropriate dose.  Pt is past due for lab work.  Has upcoming appt with C Walker.  Requested labs be done at that time.  Refill approved.

## 2023-11-10 DIAGNOSIS — D485 Neoplasm of uncertain behavior of skin: Secondary | ICD-10-CM | POA: Diagnosis not present

## 2023-11-10 DIAGNOSIS — D225 Melanocytic nevi of trunk: Secondary | ICD-10-CM | POA: Diagnosis not present

## 2023-12-07 DIAGNOSIS — Z0000 Encounter for general adult medical examination without abnormal findings: Secondary | ICD-10-CM | POA: Diagnosis not present

## 2023-12-07 DIAGNOSIS — N319 Neuromuscular dysfunction of bladder, unspecified: Secondary | ICD-10-CM | POA: Diagnosis not present

## 2023-12-07 DIAGNOSIS — I4891 Unspecified atrial fibrillation: Secondary | ICD-10-CM | POA: Diagnosis not present

## 2023-12-07 DIAGNOSIS — Z1331 Encounter for screening for depression: Secondary | ICD-10-CM | POA: Diagnosis not present

## 2023-12-07 DIAGNOSIS — E039 Hypothyroidism, unspecified: Secondary | ICD-10-CM | POA: Diagnosis not present

## 2023-12-07 DIAGNOSIS — E7800 Pure hypercholesterolemia, unspecified: Secondary | ICD-10-CM | POA: Diagnosis not present

## 2023-12-07 LAB — LAB REPORT - SCANNED: EGFR: 69

## 2024-01-16 ENCOUNTER — Ambulatory Visit (HOSPITAL_BASED_OUTPATIENT_CLINIC_OR_DEPARTMENT_OTHER): Payer: Medicare Other | Admitting: Family

## 2024-01-16 ENCOUNTER — Encounter (HOSPITAL_BASED_OUTPATIENT_CLINIC_OR_DEPARTMENT_OTHER): Payer: Self-pay

## 2024-01-24 ENCOUNTER — Ambulatory Visit (INDEPENDENT_AMBULATORY_CARE_PROVIDER_SITE_OTHER): Admitting: Family

## 2024-01-24 ENCOUNTER — Encounter (HOSPITAL_BASED_OUTPATIENT_CLINIC_OR_DEPARTMENT_OTHER): Payer: Self-pay | Admitting: Family

## 2024-01-24 VITALS — BP 110/60 | HR 77 | Ht 74.0 in | Wt 205.0 lb

## 2024-01-24 DIAGNOSIS — I4819 Other persistent atrial fibrillation: Secondary | ICD-10-CM

## 2024-01-24 DIAGNOSIS — I452 Bifascicular block: Secondary | ICD-10-CM | POA: Diagnosis not present

## 2024-01-24 DIAGNOSIS — D6859 Other primary thrombophilia: Secondary | ICD-10-CM

## 2024-01-24 LAB — CBC
Hematocrit: 50.1 % (ref 37.5–51.0)
Hemoglobin: 16 g/dL (ref 13.0–17.7)
MCH: 29.7 pg (ref 26.6–33.0)
MCHC: 31.9 g/dL (ref 31.5–35.7)
MCV: 93 fL (ref 79–97)
Platelets: 178 10*3/uL (ref 150–450)
RBC: 5.38 x10E6/uL (ref 4.14–5.80)
RDW: 12.9 % (ref 11.6–15.4)
WBC: 7 10*3/uL (ref 3.4–10.8)

## 2024-01-24 MED ORDER — METOPROLOL SUCCINATE ER 25 MG PO TB24: 12.5000 mg | ORAL_TABLET | ORAL | 1 refills | Status: DC | PRN

## 2024-01-24 NOTE — Progress Notes (Signed)
 Cardiology Office Note   Date:  01/24/2024  ID:  Brandon Porter, DOB 1941-03-29, MRN [id number] PCP: Glena Landau, MD  [facility] Providers Cardiologist:  Sheryle Donning, MD     History of Present Illness Brandon Porter is a 83 y.o. male with history of persistent atrial fibrillation, PVC, bifascicular heart block, neurogenic bladder with self cath, hypothyroidism.  Last seen 06/23/2023.  Persistent atrial fibrillation was managed well with as needed metoprolol , felt foggy with daily dosing previously.  He declined cardioversion as he felt no different in atrial fibrillation then in NSR.  Anticoagulation versus watchman discussed, he decided to continue anticoagulation.  Presents today for follow up independently. Enjoys spending time with his grandchildren who are 41-2 years old. Recently walked a 5K with a 3.5 mph pace and felt week. About 5-6 weeks ago while on a trip started noticing some cloudiness in his urine while self cathing and does note sometimes he gets cut doing this. However, had some hematuria ongoing for 3-4 weeks. He was given instructions by primary care regarding his methenamine and symptoms have resolved. He does not require his Metoprolol , last having to take a year ago. He monitors EKG periodically at home with [facility].  Endorses eating at home predominantly and following low sodium diet.   ROS: Please see the history of present illness.    All other systems reviewed and are negative.   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 Confirmed by Neomi Banks ([id number]) on 01/24/2024 10:15:23 AM    Cardiac Studies & Procedures    ______________________________________________________________________________________________   STRESS TESTS  NM MYOCAR MULTI W/SPECT W 11/07/2018  Narrative CLINICAL DATA:  Chest pain.  EXAM: MYOCARDIAL IMAGING WITH SPECT (REST AND PHARMACOLOGIC-STRESS)  GATED LEFT VENTRICULAR WALL MOTION STUDY  LEFT VENTRICULAR EJECTION FRACTION  TECHNIQUE: Standard myocardial SPECT imaging was performed after resting intravenous injection of 10 mCi Tc-96m tetrofosmin . Subsequently, intravenous infusion of Lexiscan  was performed under the supervision of the Cardiology staff. At peak effect of the drug, 30 mCi Tc-56m tetrofosmin  was injected intravenously and standard myocardial SPECT imaging was performed. Quantitative gated imaging was also performed to evaluate left ventricular wall motion, and estimate left ventricular ejection fraction.  COMPARISON:  Chest radiograph-11/07/2018; chest CT-07/08/2009  FINDINGS: Raw images: GI attenuation is seen on both provided rest stress images. There is no significant patient motion artifact. There is no significant chest wall attenuation.  Perfusion: There is a minimal amount of attenuation involving the inferior wall left ventricle which resolves on the provided stress images. There is a grossly matched area of attenuation involving the apex of the left ventricle which is without associated regional wall motion abnormality. No definitive scintigraphic evidence of prior infarction or pharmacologically induced ischemia.  Wall Motion: Mild global hypokinesia with relative akinesia involving the septum of the left ventricle.  Left Ventricular Ejection Fraction: 43 %  End diastolic volume 150 ml  End systolic volume 85 ml  IMPRESSION: 1. No definitive scintigraphic evidence of prior infarction pharmacologically induced ischemia.  2. Mild global hypokinesia with relative akinesia involving the septum.  3. Left ventricular ejection  fraction - 43%   Electronically Signed By: Robbi Childs M.D. On: 11/07/2018 13:44  ECHOCARDIOGRAM  ECHOCARDIOGRAM COMPLETE 02/14/2019  Narrative ECHOCARDIOGRAM REPORT    Patient Name:   Brandon Porter Date of Exam: 02/14/2019 Medical Rec #:  [id number]       Height:       74.5 in Accession #:    [id number]      Weight:       209.0 lb Date of Birth:  07/24/1941       BSA:          2.22 m Patient Age:    78 years        BP:           126/86 mmHg Patient Gender: M               HR:           57 bpm. Exam Location:  [address]   Procedure: 2D Echo, Cardiac Doppler and Color Doppler  Indications:    R07.9* Chest pain, unspecified  History:        Patient has no prior history of Echocardiogram examinations. Risk Factors: Dyslipidemia and Former Smoker. Right bundle branch block.  Sonographer:    Mylinda Asa RCS Referring Phys: [id number] DAYNA N DUNN  IMPRESSIONS   1. The left ventricle has low normal systolic function, with an ejection fraction of 50-55%. The cavity size was mildly dilated. Left ventricular diastolic Doppler parameters are consistent with impaired relaxation. No evidence of left ventricular regional wall motion abnormalities. 2. The right ventricle has normal systolic function. The cavity was normal. There is no increase in right ventricular wall thickness. Right ventricular systolic pressure could not be assessed. 3. Left atrial size was mildly dilated. 4. Right atrial size was mildly dilated. 5. The aortic root and ascending aorta are normal in size and structure.  SUMMARY  Frequent PVCs occurred during the study. FINDINGS Left Ventricle: The left ventricle has low normal systolic function, with an ejection fraction of 50-55%. The cavity size was mildly dilated. There is no increase in left ventricular wall thickness. Left ventricular diastolic Doppler parameters are consistent with impaired relaxation. Normal left ventricular filling pressures No evidence  of left ventricular regional wall motion abnormalities..  Right Ventricle: The right ventricle has normal systolic function. The cavity was normal. There is no increase in right ventricular wall thickness. Right ventricular systolic pressure could not be assessed.  Left Atrium: Left atrial size was mildly dilated.  Right Atrium: Right atrial size was mildly dilated. Right atrial pressure is estimated at 10 mmHg.  Interatrial Septum: No atrial level shunt detected by color flow Doppler.  Pericardium: There is no evidence of pericardial effusion.  Mitral Valve: The mitral valve is normal in structure. Mitral valve regurgitation is not visualized by color flow Doppler.  Tricuspid Valve: The tricuspid valve is normal in structure. Tricuspid valve regurgitation was not visualized by color flow Doppler.  Aortic Valve: The aortic valve is normal in structure. Aortic valve regurgitation was not visualized by color flow Doppler.  Pulmonic Valve: The pulmonic valve was grossly normal. Pulmonic valve regurgitation is not visualized by color flow Doppler.  Aorta: The aortic root and ascending aorta are normal in size and structure.   +--------------+--------++ LEFT VENTRICLE         +----------------+---------++ +--------------+--------++ Diastology                PLAX 2D                +----------------+---------++ +--------------+--------++ LV e' lateral:  7.07 cm/s  LVIDd:        5.77 cm  +----------------+---------++ +--------------+--------++ LV E/e' lateral:5.0       LVIDs:        4.28 cm  +----------------+---------++ +--------------+--------++ LV e' medial:   5.66 cm/s LV PW:        1.10 cm  +----------------+---------++ +--------------+--------++ LV E/e' medial: 6.3       LV IVS:       1.20 cm  +----------------+---------++ +--------------+--------++ LVOT diam:    2.15 cm  +--------------+--------++ LV SV:        82 ml     +--------------+--------++ LV SV Index:  36.76    +--------------+--------++ LVOT Area:    3.63 cm +--------------+--------++                        +--------------+--------++  +---------------+---------++ RIGHT VENTRICLE          +---------------+---------++ RV Basal diam: 2.58 cm   +---------------+---------++ RV S prime:    9.57 cm/s +---------------+---------++ TAPSE (M-mode):1.6 cm    +---------------+---------++  +---------------+-------++-----------++ LEFT ATRIUM           Index       +---------------+-------++-----------++ LA diam:       3.90 cm1.75 cm/m  +---------------+-------++-----------++ LA Vol (A2C):  63.5 ml28.54 ml/m +---------------+-------++-----------++ LA Vol (A4C):  65.5 ml29.44 ml/m +---------------+-------++-----------++ LA Biplane Vol:65.8 ml29.58 ml/m +---------------+-------++-----------++ +------------+---------++-----------++ RIGHT ATRIUM         Index       +------------+---------++-----------++ RA Area:    22.50 cm            +------------+---------++-----------++ RA Volume:  65.80 ml 29.58 ml/m +------------+---------++-----------++ +------------+-----------++ AORTIC VALVE            +------------+-----------++ LVOT Vmax:  79.60 cm/s  +------------+-----------++ LVOT Vmean: 50.000 cm/s +------------+-----------++ LVOT VTI:   0.168 m     +------------+-----------++  +-------------+-------++ AORTA                +-------------+-------++ Ao Root diam:3.90 cm +-------------+-------++  +--------------+--------++ MITRAL VALVE             +--------------+-------+ +--------------+--------++   SHUNTS                MV Area (PHT):cm        +--------------+-------+ +--------------+--------++   Systemic VTI: 0.17 m  MV PHT:       msec       +--------------+-------+ +--------------+--------++   Systemic  Diam:2.15 cm MV Decel Time:424 msec   +--------------+-------+ +--------------+--------++ +--------------+----------++ MV E velocity:35.60 cm/s +--------------+----------++ MV A velocity:66.80 cm/s +--------------+----------++ MV E/A ratio: 0.53       +--------------+----------++   Luana Rumple MD Electronically signed by Luana Rumple MD Signature Date/Time: 02/14/2019/1:37:30 PM    Final    MONITORS  LONG TERM MONITOR (3-14 DAYS) 03/01/2019  Narrative Sinus bradycardia, NSR, sinus tachycardia, PACs, brief PAT, PVCs and 5 beats NSVT Alexandria Angel, MD       ______________________________________________________________________________________________      Risk Assessment/Calculations  CHA2DS2-VASc Score = 2   This indicates a 2.2% annual risk of stroke. The patient's score is based upon: CHF History: 0 HTN History: 0 Diabetes History: 0 Stroke History: 0 Vascular Disease History: 0 Age Score: 2 Gender Score: 0            Physical Exam VS:  BP 110/60 (BP Location: Left Arm)   Pulse 77   Ht 6' 2 (1.88 m)   Wt 205 lb (93 kg)  SpO2 95%   BMI 26.32 kg/m    Wt Readings from Last 3 Encounters:  01/24/24 205 lb (93 kg)  06/23/23 209 lb 3.2 oz (94.9 kg)  09/29/22 209 lb 8 oz (95 kg)    GEN: Well nourished, well developed in no acute distress NECK: No JVD; No carotid bruits CARDIAC: IRIR, no murmurs, rubs, gallops RESPIRATORY:  Clear to auscultation without rales, wheezing or rhonchi  ABDOMEN: Soft, non-tender, non-distended EXTREMITIES:  No edema; No deformity   ASSESSMENT AND PLAN  Persistent atrial fibrillation/hypercoagulable state - Rate controlled today. Not requiring PRN Metoprolol , refill provided as his prior Rx is out of date. CHA2DS2-VASc Score = 2 [CHF History: 0, HTN History: 0, Diabetes History: 0, Stroke History: 0, Vascular Disease History: 0, Age Score: 2, Gender Score: 0].  Therefore, the patient's annual risk of  stroke is 2.2 %.    Continue Xarelto  20mg  daily. 12/07/23 Creatinine 1.08, GFR 69 (CrCl 69 mL/min). CBC today for monitoring.   Bifasicular heart block - Stable finding by EKG. No near syncope, syncope. Monitor with periodic EKG.   Hypothyroidism - Continue to follow with PCP.        Dispo: follow up in 1 year with Dr. Veryl Gottron or APP  Signed, Clearnce Curia, NP

## 2024-01-24 NOTE — Patient Instructions (Addendum)
 Medication Instructions:  Continue your current medications.   Lab Work: Your physician recommends that you return for lab work today: CBC  Testing/Procedures: Your EKG today showed rate controlled atrial fibrillation.   Follow-Up:  Your next appointment:   1 year(s)  Provider:   Sheryle Donning, MD, Slater Duncan, NP, or Neomi Banks, NP      Other Instructions  Heart Healthy Diet Recommendations: A low-salt diet is recommended. Meats should be grilled, baked, or boiled. Avoid fried foods. Focus on lean protein sources like fish or chicken with vegetables and fruits. The American Heart Association is a Chief Technology Officer!  American Heart Association Diet and Lifeystyle Recommendations   Exercise recommendations: The American Heart Association recommends 150 minutes of moderate intensity exercise weekly. Try 30 minutes of moderate intensity exercise 4-5 times per week. This could include walking, jogging, or swimming.

## 2024-01-25 ENCOUNTER — Ambulatory Visit (HOSPITAL_BASED_OUTPATIENT_CLINIC_OR_DEPARTMENT_OTHER): Payer: Self-pay | Admitting: Family

## 2024-01-25 DIAGNOSIS — Z7901 Long term (current) use of anticoagulants: Secondary | ICD-10-CM

## 2024-01-25 DIAGNOSIS — I4819 Other persistent atrial fibrillation: Secondary | ICD-10-CM

## 2024-01-25 MED ORDER — RIVAROXABAN 20 MG PO TABS: 20.0000 mg | ORAL_TABLET | Freq: Every day | ORAL | 3 refills | Status: AC

## 2024-01-25 NOTE — Telephone Encounter (Signed)
-----   Message from Clearnce Curia sent at 01/25/2024  2:00 PM EDT ----- CBC with no anemia nor infection. Please provide Xarelto  20mg  daily refill 90 days with 3 refills.   ----- Message ----- From: Garner Jury Lab Results In Sent: 01/24/2024   8:35 PM EDT To: Clearnce Curia, NP

## 2024-02-15 ENCOUNTER — Other Ambulatory Visit (HOSPITAL_BASED_OUTPATIENT_CLINIC_OR_DEPARTMENT_OTHER): Payer: Self-pay | Admitting: Family

## 2024-02-15 DIAGNOSIS — I4819 Other persistent atrial fibrillation: Secondary | ICD-10-CM

## 2024-02-27 DIAGNOSIS — K4090 Unilateral inguinal hernia, without obstruction or gangrene, not specified as recurrent: Secondary | ICD-10-CM | POA: Diagnosis not present

## 2024-02-27 DIAGNOSIS — N390 Urinary tract infection, site not specified: Secondary | ICD-10-CM | POA: Diagnosis not present

## 2024-04-10 DIAGNOSIS — C44311 Basal cell carcinoma of skin of nose: Secondary | ICD-10-CM | POA: Diagnosis not present

## 2024-04-10 DIAGNOSIS — C44519 Basal cell carcinoma of skin of other part of trunk: Secondary | ICD-10-CM | POA: Diagnosis not present

## 2024-04-10 DIAGNOSIS — L821 Other seborrheic keratosis: Secondary | ICD-10-CM | POA: Diagnosis not present

## 2024-04-10 DIAGNOSIS — D485 Neoplasm of uncertain behavior of skin: Secondary | ICD-10-CM | POA: Diagnosis not present

## 2024-04-10 DIAGNOSIS — D225 Melanocytic nevi of trunk: Secondary | ICD-10-CM | POA: Diagnosis not present

## 2024-04-10 DIAGNOSIS — Z85828 Personal history of other malignant neoplasm of skin: Secondary | ICD-10-CM | POA: Diagnosis not present

## 2024-04-10 DIAGNOSIS — Z86018 Personal history of other benign neoplasm: Secondary | ICD-10-CM | POA: Diagnosis not present

## 2024-04-10 DIAGNOSIS — L578 Other skin changes due to chronic exposure to nonionizing radiation: Secondary | ICD-10-CM | POA: Diagnosis not present

## 2024-04-10 DIAGNOSIS — L570 Actinic keratosis: Secondary | ICD-10-CM | POA: Diagnosis not present

## 2024-04-18 DIAGNOSIS — N529 Male erectile dysfunction, unspecified: Secondary | ICD-10-CM | POA: Diagnosis not present

## 2024-04-18 DIAGNOSIS — N442 Benign cyst of testis: Secondary | ICD-10-CM | POA: Diagnosis not present

## 2024-04-27 ENCOUNTER — Ambulatory Visit (HOSPITAL_BASED_OUTPATIENT_CLINIC_OR_DEPARTMENT_OTHER): Admitting: Family
